# Patient Record
Sex: Male | Born: 1950 | Hispanic: No | Marital: Married | Smoking: Never smoker
Health system: Southern US, Community
[De-identification: ages and names within clinical notes are randomized; demographics above are authoritative.]

## PROBLEM LIST (undated history)

## (undated) DIAGNOSIS — N189 Chronic kidney disease, unspecified: Secondary | ICD-10-CM

## (undated) DIAGNOSIS — K219 Gastro-esophageal reflux disease without esophagitis: Secondary | ICD-10-CM

## (undated) DIAGNOSIS — I10 Essential (primary) hypertension: Secondary | ICD-10-CM

## (undated) DIAGNOSIS — R519 Headache, unspecified: Secondary | ICD-10-CM

## (undated) DIAGNOSIS — R51 Headache: Secondary | ICD-10-CM

## (undated) DIAGNOSIS — E785 Hyperlipidemia, unspecified: Secondary | ICD-10-CM

## (undated) DIAGNOSIS — C801 Malignant (primary) neoplasm, unspecified: Secondary | ICD-10-CM

## (undated) DIAGNOSIS — J302 Other seasonal allergic rhinitis: Secondary | ICD-10-CM

## (undated) HISTORY — DX: Other seasonal allergic rhinitis: J30.2

## (undated) HISTORY — DX: Gastro-esophageal reflux disease without esophagitis: K21.9

---
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
## 1971-03-21 HISTORY — PX: LYMPH NODE BIOPSY: SHX201

## 2003-10-15 ENCOUNTER — Encounter (INDEPENDENT_AMBULATORY_CARE_PROVIDER_SITE_OTHER): Payer: Self-pay | Admitting: *Deleted

## 2003-10-15 ENCOUNTER — Ambulatory Visit (HOSPITAL_COMMUNITY): Admission: RE | Admit: 2003-10-15 | Discharge: 2003-10-15 | Payer: Self-pay | Admitting: Gastroenterology

## 2004-03-24 ENCOUNTER — Encounter: Admission: RE | Admit: 2004-03-24 | Discharge: 2004-03-24 | Payer: Self-pay | Admitting: Internal Medicine

## 2004-06-26 ENCOUNTER — Emergency Department (HOSPITAL_COMMUNITY): Admission: EM | Admit: 2004-06-26 | Discharge: 2004-06-26 | Payer: Self-pay | Admitting: Family Medicine

## 2006-12-17 ENCOUNTER — Encounter: Admission: RE | Admit: 2006-12-17 | Discharge: 2006-12-17 | Payer: Self-pay | Admitting: Internal Medicine

## 2010-04-11 ENCOUNTER — Encounter: Payer: Self-pay | Admitting: Internal Medicine

## 2010-08-05 NOTE — Op Note (Signed)
NAME:  Frederick Weaver, Frederick Weaver                  ACCOUNT NO.:  [id number]   MEDICAL RECORD NO.:  [id number]                   PATIENT TYPE:   LOCATION:                                       FACILITY:   PHYSICIAN:  Danise Edge, M.D.                DATE OF BIRTH:  Oct 08, 1950   DATE OF PROCEDURE:  DATE OF DISCHARGE:                                 OPERATIVE REPORT   PROCEDURE:  Colonoscopy and polypectomy.   INDICATIONS FOR PROCEDURE:  Frederick Weaver is a 60 year old male born  November 13, 1950.  The patient is scheduled to undergo colonoscopy and  polypectomy to prevent colon cancer.   ENDOSCOPIST:  Danise Edge, M.D.   PREMEDICATION:  Fentanyl 50 mcg, Versed 10 mg.   DESCRIPTION OF PROCEDURE:  After obtaining informed consent, Frederick Weaver  was  placed in the left lateral decubitus position. I administered intravenous  fentanyl and intravenous Versed to achieve conscious sedation for the  procedure. The patient's blood pressure, oxygen saturation and cardiac  rhythm were monitored throughout the procedure and documented in the medical  record.   Anal inspection and digital rectal exam were normal. The prostate was non-  nodular. The Olympus adjustable pediatric colonoscope was introduced into  the rectum and advanced to the cecum. Colonic preparation for the exam today  was excellent.   RECTUM:  Normal.   SIGMOID COLON AND DESCENDING COLON:  Left colonic diverticulosis.   SPLENIC FLEXURE:  Normal.   TRANSVERSE COLON:  Normal.   HEPATIC FLEXURE:  Normal.   ASCENDING COLON:  Normal.   CECUM AND ILEOCECAL VALVE:  From the proximal cecum, a 2 mm sessile polyp  was removed with the electrocautery snare.   ASSESSMENT:  1. From the proximal cecum, a small polyp was removed with the     electrocautery snare.  2. Left colonic diverticulosis.   RECOMMENDATIONS:  Repeat colonoscopy in five years.                                               Danise Edge, M.D.    MJ/MEDQ  D:  10/15/2003  T:  10/15/2003  Job:  [id number]   cc:   Candyce Churn, M.D.  [address] [place]  [place] [address]  Fax: [phone]

## 2014-04-20 ENCOUNTER — Ambulatory Visit
Admission: RE | Admit: 2014-04-20 | Discharge: 2014-04-20 | Disposition: A | Discharge status: Home / Self Care | Payer: Medicare HMO | Source: Ambulatory Visit | Attending: Internal Medicine | Admitting: Internal Medicine

## 2014-04-20 ENCOUNTER — Other Ambulatory Visit: Payer: Self-pay | Admitting: Internal Medicine

## 2014-04-20 DIAGNOSIS — R05 Cough: Secondary | ICD-10-CM

## 2014-04-20 DIAGNOSIS — R059 Cough, unspecified: Secondary | ICD-10-CM

## 2014-07-16 ENCOUNTER — Other Ambulatory Visit: Payer: Self-pay | Admitting: Gastroenterology

## 2014-11-04 ENCOUNTER — Encounter (HOSPITAL_COMMUNITY): Payer: Self-pay | Admitting: *Deleted

## 2014-11-09 ENCOUNTER — Ambulatory Visit (HOSPITAL_COMMUNITY): Payer: Managed Care, Other (non HMO) | Admitting: Anesthesiology

## 2014-11-09 ENCOUNTER — Encounter (HOSPITAL_COMMUNITY)
Admission: RE | Disposition: A | Discharge status: Home / Self Care | Payer: Self-pay | Source: Ambulatory Visit | Attending: Gastroenterology

## 2014-11-09 ENCOUNTER — Ambulatory Visit (HOSPITAL_COMMUNITY)
Admission: RE | Admit: 2014-11-09 | Discharge: 2014-11-09 | Disposition: A | Discharge status: Home / Self Care | Payer: Managed Care, Other (non HMO) | Source: Ambulatory Visit | Attending: Gastroenterology | Admitting: Gastroenterology

## 2014-11-09 ENCOUNTER — Encounter (HOSPITAL_COMMUNITY): Payer: Self-pay

## 2014-11-09 DIAGNOSIS — Z1211 Encounter for screening for malignant neoplasm of colon: Secondary | ICD-10-CM | POA: Diagnosis not present

## 2014-11-09 DIAGNOSIS — Z79899 Other long term (current) drug therapy: Secondary | ICD-10-CM | POA: Insufficient documentation

## 2014-11-09 DIAGNOSIS — I10 Essential (primary) hypertension: Secondary | ICD-10-CM | POA: Insufficient documentation

## 2014-11-09 DIAGNOSIS — K5730 Diverticulosis of large intestine without perforation or abscess without bleeding: Secondary | ICD-10-CM | POA: Insufficient documentation

## 2014-11-09 DIAGNOSIS — N400 Enlarged prostate without lower urinary tract symptoms: Secondary | ICD-10-CM | POA: Diagnosis not present

## 2014-11-09 DIAGNOSIS — E780 Pure hypercholesterolemia: Secondary | ICD-10-CM | POA: Diagnosis not present

## 2014-11-09 DIAGNOSIS — Z86010 Personal history of colonic polyps: Secondary | ICD-10-CM | POA: Insufficient documentation

## 2014-11-09 HISTORY — DX: Chronic kidney disease, unspecified: N18.9

## 2014-11-09 HISTORY — DX: Malignant (primary) neoplasm, unspecified: C80.1

## 2014-11-09 HISTORY — PX: COLONOSCOPY WITH PROPOFOL: SHX5780

## 2014-11-09 HISTORY — DX: Headache, unspecified: R51.9

## 2014-11-09 HISTORY — DX: Hyperlipidemia, unspecified: E78.5

## 2014-11-09 HISTORY — DX: Headache: R51

## 2014-11-09 HISTORY — DX: Essential (primary) hypertension: I10

## 2014-11-09 SURGERY — COLONOSCOPY WITH PROPOFOL
Anesthesia: Monitor Anesthesia Care

## 2014-11-09 MED ORDER — PROPOFOL 10 MG/ML IV BOLUS
INTRAVENOUS | Status: AC
Start: 2014-11-09 — End: 2014-11-09
  Filled 2014-11-09: qty 20

## 2014-11-09 MED ORDER — PROPOFOL 10 MG/ML IV BOLUS
INTRAVENOUS | Status: DC | PRN
  Administered 2014-11-09 (×3): 20 mg via INTRAVENOUS
  Administered 2014-11-09: 30 mg via INTRAVENOUS
  Administered 2014-11-09: 10 mg via INTRAVENOUS
  Administered 2014-11-09 (×3): 20 mg via INTRAVENOUS
  Administered 2014-11-09: 30 mg via INTRAVENOUS
  Administered 2014-11-09 (×2): 10 mg via INTRAVENOUS

## 2014-11-09 MED ORDER — SODIUM CHLORIDE 0.9 % IV SOLN: INTRAVENOUS | Status: DC

## 2014-11-09 MED ORDER — LACTATED RINGERS IV SOLN
INTRAVENOUS | Status: DC
  Administered 2014-11-09: 1000 mL via INTRAVENOUS

## 2014-11-09 SURGICAL SUPPLY — 21 items

## 2014-11-09 NOTE — H&P (Signed)
  Procedure: Surveillance colonoscopy. 10/22/2008 normal surveillance colonoscopy performed. In 2005, colonoscopy performed with removal of two small adenomatous colon polyps  History: The patient is a 64 year old male born 1950/07/12. He is scheduled to undergo a surveillance colonoscopy today.  Past medical history: Hypertension. Hypercholesterolemia. Benign prostatic hypertrophy. Rosacea. Bilateral inguinal hernia surgery.  Exam: The patient is alert and lying comfortably on the endoscopy stretcher. Abdomen is soft and nontender to palpation. Lungs are clear to auscultation. Cardiac exam reveals a regular rhythm.  Plan: Proceed with surveillance colonoscopy

## 2014-11-09 NOTE — Discharge Instructions (Signed)

## 2014-11-09 NOTE — Transfer of Care (Signed)
Immediate Anesthesia Transfer of Care Note  Patient: Frederick Weaver  Procedure(s) Performed: Procedure(s): COLONOSCOPY WITH PROPOFOL (N/A)  Patient Location: PACU  Anesthesia Type:MAC  Level of Consciousness: sedated  Airway & Oxygen Therapy: Patient Spontanous Breathing and Patient connected to nasal cannula oxygen  Post-op Assessment: Report given to RN and Post -op Vital signs reviewed and stable  Post vital signs: Reviewed and stable  Last Vitals:  Filed Vitals:   11/09/14 0853  BP: 150/82  Pulse: 77  Temp: 36.4 C  Resp: 12    Complications: No apparent anesthesia complications

## 2014-11-09 NOTE — Op Note (Signed)
Procedure: Surveillance colonoscopy. 10/22/2008 normal surveillance colonoscopy performed. In 2005, colonoscopy performed with removal of a 2 mm adenomatous cecal polyp  Endoscopist: Earle Gell  Premedication: Propofol administered by anesthesia  Procedure: The patient was placed in the left lateral decubitus position. Anal inspection and digital rectal exam were normal. The Pentax pediatric colonoscope was introduced into the rectum and advanced to the cecum. A normal-appearing appendiceal orifice and ileocecal valve were identified. Colonic preparation for the exam today was good. Withdrawal time was 7 minutes  Rectum. Normal. Retroflexed view of the distal rectum was normal  Sigmoid colon and descending colon. Left colonic diverticulosis  Splenic flexure. Normal  Transverse colon. Normal  Hepatic flexure. Normal  Ascending colon. Normal  Cecum and ileocecal valve. Normal  Assessment: Normal surveillance colonoscopy  Recommendation: Schedule repeat surveillance colonoscopy in 5-10 years

## 2014-11-09 NOTE — Anesthesia Preprocedure Evaluation (Signed)
Anesthesia Evaluation  Patient identified by MRN, date of birth, ID band Patient awake    Reviewed: Allergy & Precautions, NPO status , Patient's Chart, lab work & pertinent test results  Airway Mallampati: II  TM Distance: >3 FB Neck ROM: Full    Dental no notable dental hx.    Pulmonary neg pulmonary ROS,  breath sounds clear to auscultation  Pulmonary exam normal       Cardiovascular hypertension, Pt. on medications Normal cardiovascular examRhythm:Regular Rate:Normal     Neuro/Psych negative neurological ROS  negative psych ROS   GI/Hepatic negative GI ROS, Neg liver ROS,   Endo/Other  negative endocrine ROS  Renal/GU negative Renal ROS  negative genitourinary   Musculoskeletal negative musculoskeletal ROS (+)   Abdominal   Peds negative pediatric ROS (+)  Hematology negative hematology ROS (+)   Anesthesia Other Findings   Reproductive/Obstetrics negative OB ROS                             Anesthesia Physical Anesthesia Plan  ASA: II  Anesthesia Plan: MAC   Post-op Pain Management:    Induction:   Airway Management Planned: Simple Face Mask  Additional Equipment:   Intra-op Plan:   Post-operative Plan:   Informed Consent: I have reviewed the patients History and Physical, chart, labs and discussed the procedure including the risks, benefits and alternatives for the proposed anesthesia with the patient or authorized representative who has indicated his/her understanding and acceptance.   Dental advisory given  Plan Discussed with: CRNA  Anesthesia Plan Comments:         Anesthesia Quick Evaluation

## 2014-11-09 NOTE — Anesthesia Postprocedure Evaluation (Signed)
  Anesthesia Post-op Note  Patient: Frederick Weaver Mech  Procedure(s) Performed: Procedure(s) (LRB): COLONOSCOPY WITH PROPOFOL (N/A)  Patient Location: PACU  Anesthesia Type: MAC  Level of Consciousness: awake and alert   Airway and Oxygen Therapy: Patient Spontanous Breathing  Post-op Pain: mild  Post-op Assessment: Post-op Vital signs reviewed, Patient's Cardiovascular Status Stable, Respiratory Function Stable, Patent Airway and No signs of Nausea or vomiting  Last Vitals:  Filed Vitals:   11/09/14 1030  BP: 118/79  Pulse: 83  Temp:   Resp: 18    Post-op Vital Signs: stable   Complications: No apparent anesthesia complications

## 2014-11-10 ENCOUNTER — Encounter (HOSPITAL_COMMUNITY): Payer: Self-pay | Admitting: Gastroenterology

## 2016-09-20 ENCOUNTER — Encounter (HOSPITAL_COMMUNITY): Payer: Self-pay | Admitting: Emergency Medicine

## 2016-09-20 ENCOUNTER — Ambulatory Visit (HOSPITAL_COMMUNITY)
Admission: EM | Admit: 2016-09-20 | Discharge: 2016-09-20 | Disposition: A | Discharge status: Home / Self Care | Payer: BLUE CROSS/BLUE SHIELD | Attending: Family Medicine | Admitting: Family Medicine

## 2016-09-20 DIAGNOSIS — H1033 Unspecified acute conjunctivitis, bilateral: Secondary | ICD-10-CM

## 2016-09-20 MED ORDER — TOBRAMYCIN 0.3 % OP SOLN: 1.0000 [drp] | Freq: Four times a day (QID) | OPHTHALMIC | 0 refills | Status: DC

## 2016-09-20 NOTE — ED Triage Notes (Signed)
The patient presented to the [facility] with a complaint of draining and red eyes x 2 days. The patient also complained of a possible insect bite to the right side.

## 2016-09-21 NOTE — ED Provider Notes (Signed)
  [place]   [id number] 09/20/16 Arrival Time: [place]:  Today you were diagnosed with the following: 1. Acute conjunctivitis of both eyes, unspecified acute conjunctivitis type     Meds ordered this encounter  Medications  . tobramycin (TOBREX) 0.3 % ophthalmic solution    Sig: Place 1 drop into both eyes every 6 (six) hours.    Dispense:  5 mL    Refill:  0   Observe symptoms. Ensure good handwashing. Reviewed expectations re: course of current medical issues. Questions answered. Outlined signs and symptoms indicating need for more acute intervention. Patient verbalized understanding. After Visit Summary given.   SUBJECTIVE:  Frederick Weaver is a 66 y.o. male who presents with complaint of bilateral pinkeye for 2-3 days. Irritated feeling. Watery discharge now thicker today. "Matted this morning". No eye pain or visual changes. Does not wear contacts. No OTC treatment. No light sensitivity.  ROS: As per HPI.   OBJECTIVE:  Vitals:   09/20/16 2018  BP: (!) 165/90  Pulse: 83  Resp: 18  Temp: 98 F (36.7 C)  TempSrc: Oral  SpO2: 95%     General appearance: alert; no distress Head: normocephalic; atraumatic Eyes: bilateral scleral injection; watery discharge; EOMI Skin: warm and dry; no rashes or lesions Neurologic: Alert and oriented X 3; normal symmetric reflexes; normal gait Psychological:  Alert and cooperative. Normal mood and affect   Allergies  Allergen Reactions  . Codeine Nausea Only  . Demerol [Meperidine] Nausea Only  . Ampicillin Rash  . Doxycycline Rash  . Penicillins Rash    PMHx, SurgHx, SocialHx, Medications, and Allergies were reviewed in the Visit Navigator and updated as appropriate.       Vanessa Kick, MD 09/21/16 [phone]

## 2017-02-01 DIAGNOSIS — E291 Testicular hypofunction: Secondary | ICD-10-CM | POA: Diagnosis not present

## 2017-03-08 DIAGNOSIS — E291 Testicular hypofunction: Secondary | ICD-10-CM | POA: Diagnosis not present

## 2017-03-27 DIAGNOSIS — L905 Scar conditions and fibrosis of skin: Secondary | ICD-10-CM | POA: Diagnosis not present

## 2017-03-27 DIAGNOSIS — L820 Inflamed seborrheic keratosis: Secondary | ICD-10-CM | POA: Diagnosis not present

## 2017-05-01 DIAGNOSIS — M25569 Pain in unspecified knee: Secondary | ICD-10-CM | POA: Diagnosis not present

## 2017-05-01 DIAGNOSIS — E669 Obesity, unspecified: Secondary | ICD-10-CM | POA: Diagnosis not present

## 2017-05-01 DIAGNOSIS — E7800 Pure hypercholesterolemia, unspecified: Secondary | ICD-10-CM | POA: Diagnosis not present

## 2017-05-01 DIAGNOSIS — E291 Testicular hypofunction: Secondary | ICD-10-CM | POA: Diagnosis not present

## 2017-05-01 DIAGNOSIS — Z0000 Encounter for general adult medical examination without abnormal findings: Secondary | ICD-10-CM | POA: Diagnosis not present

## 2017-05-01 DIAGNOSIS — E559 Vitamin D deficiency, unspecified: Secondary | ICD-10-CM | POA: Diagnosis not present

## 2017-05-01 DIAGNOSIS — I10 Essential (primary) hypertension: Secondary | ICD-10-CM | POA: Diagnosis not present

## 2017-05-01 DIAGNOSIS — L719 Rosacea, unspecified: Secondary | ICD-10-CM | POA: Diagnosis not present

## 2017-05-01 DIAGNOSIS — Z23 Encounter for immunization: Secondary | ICD-10-CM | POA: Diagnosis not present

## 2017-05-01 DIAGNOSIS — N529 Male erectile dysfunction, unspecified: Secondary | ICD-10-CM | POA: Diagnosis not present

## 2017-06-15 DIAGNOSIS — E291 Testicular hypofunction: Secondary | ICD-10-CM | POA: Diagnosis not present

## 2017-07-24 DIAGNOSIS — L820 Inflamed seborrheic keratosis: Secondary | ICD-10-CM | POA: Diagnosis not present

## 2017-07-24 DIAGNOSIS — L308 Other specified dermatitis: Secondary | ICD-10-CM | POA: Diagnosis not present

## 2017-07-24 DIAGNOSIS — B078 Other viral warts: Secondary | ICD-10-CM | POA: Diagnosis not present

## 2017-07-25 DIAGNOSIS — R21 Rash and other nonspecific skin eruption: Secondary | ICD-10-CM | POA: Diagnosis not present

## 2017-07-25 DIAGNOSIS — E291 Testicular hypofunction: Secondary | ICD-10-CM | POA: Diagnosis not present

## 2017-08-24 DIAGNOSIS — L718 Other rosacea: Secondary | ICD-10-CM | POA: Diagnosis not present

## 2017-08-24 DIAGNOSIS — B078 Other viral warts: Secondary | ICD-10-CM | POA: Diagnosis not present

## 2017-09-07 DIAGNOSIS — E291 Testicular hypofunction: Secondary | ICD-10-CM | POA: Diagnosis not present

## 2017-09-17 DIAGNOSIS — R69 Illness, unspecified: Secondary | ICD-10-CM | POA: Diagnosis not present

## 2017-10-26 DIAGNOSIS — E291 Testicular hypofunction: Secondary | ICD-10-CM | POA: Diagnosis not present

## 2017-11-02 DIAGNOSIS — L719 Rosacea, unspecified: Secondary | ICD-10-CM | POA: Diagnosis not present

## 2017-11-02 DIAGNOSIS — Z809 Family history of malignant neoplasm, unspecified: Secondary | ICD-10-CM | POA: Diagnosis not present

## 2017-11-02 DIAGNOSIS — I10 Essential (primary) hypertension: Secondary | ICD-10-CM | POA: Diagnosis not present

## 2017-11-02 DIAGNOSIS — Z803 Family history of malignant neoplasm of breast: Secondary | ICD-10-CM | POA: Diagnosis not present

## 2017-11-02 DIAGNOSIS — Z8249 Family history of ischemic heart disease and other diseases of the circulatory system: Secondary | ICD-10-CM | POA: Diagnosis not present

## 2017-11-02 DIAGNOSIS — L309 Dermatitis, unspecified: Secondary | ICD-10-CM | POA: Diagnosis not present

## 2017-11-02 DIAGNOSIS — Z880 Allergy status to penicillin: Secondary | ICD-10-CM | POA: Diagnosis not present

## 2017-11-02 DIAGNOSIS — E785 Hyperlipidemia, unspecified: Secondary | ICD-10-CM | POA: Diagnosis not present

## 2017-11-02 DIAGNOSIS — J302 Other seasonal allergic rhinitis: Secondary | ICD-10-CM | POA: Diagnosis not present

## 2017-12-03 DIAGNOSIS — M5136 Other intervertebral disc degeneration, lumbar region: Secondary | ICD-10-CM | POA: Diagnosis not present

## 2017-12-03 DIAGNOSIS — M25561 Pain in right knee: Secondary | ICD-10-CM | POA: Diagnosis not present

## 2017-12-03 DIAGNOSIS — M25562 Pain in left knee: Secondary | ICD-10-CM | POA: Diagnosis not present

## 2017-12-13 DIAGNOSIS — N529 Male erectile dysfunction, unspecified: Secondary | ICD-10-CM | POA: Diagnosis not present

## 2017-12-13 DIAGNOSIS — E291 Testicular hypofunction: Secondary | ICD-10-CM | POA: Diagnosis not present

## 2017-12-13 DIAGNOSIS — Z23 Encounter for immunization: Secondary | ICD-10-CM | POA: Diagnosis not present

## 2017-12-14 DIAGNOSIS — L820 Inflamed seborrheic keratosis: Secondary | ICD-10-CM | POA: Diagnosis not present

## 2017-12-14 DIAGNOSIS — L720 Epidermal cyst: Secondary | ICD-10-CM | POA: Diagnosis not present

## 2017-12-14 DIAGNOSIS — D485 Neoplasm of uncertain behavior of skin: Secondary | ICD-10-CM | POA: Diagnosis not present

## 2018-01-04 ENCOUNTER — Other Ambulatory Visit: Payer: Self-pay | Admitting: Nurse Practitioner

## 2018-01-04 ENCOUNTER — Ambulatory Visit
Admission: RE | Admit: 2018-01-04 | Discharge: 2018-01-04 | Disposition: A | Discharge status: Home / Self Care | Payer: Medicare HMO | Source: Ambulatory Visit | Attending: Nurse Practitioner | Admitting: Nurse Practitioner

## 2018-01-04 DIAGNOSIS — M542 Cervicalgia: Secondary | ICD-10-CM

## 2018-01-17 DIAGNOSIS — L7211 Pilar cyst: Secondary | ICD-10-CM | POA: Diagnosis not present

## 2018-02-01 DIAGNOSIS — E291 Testicular hypofunction: Secondary | ICD-10-CM | POA: Diagnosis not present

## 2018-03-18 DIAGNOSIS — E291 Testicular hypofunction: Secondary | ICD-10-CM | POA: Diagnosis not present

## 2018-04-02 DIAGNOSIS — R69 Illness, unspecified: Secondary | ICD-10-CM | POA: Diagnosis not present

## 2018-05-09 DIAGNOSIS — E291 Testicular hypofunction: Secondary | ICD-10-CM | POA: Diagnosis not present

## 2018-05-09 DIAGNOSIS — R351 Nocturia: Secondary | ICD-10-CM | POA: Diagnosis not present

## 2018-05-09 DIAGNOSIS — R350 Frequency of micturition: Secondary | ICD-10-CM | POA: Diagnosis not present

## 2018-05-09 DIAGNOSIS — G4733 Obstructive sleep apnea (adult) (pediatric): Secondary | ICD-10-CM | POA: Diagnosis not present

## 2018-05-09 DIAGNOSIS — E559 Vitamin D deficiency, unspecified: Secondary | ICD-10-CM | POA: Diagnosis not present

## 2018-05-09 DIAGNOSIS — E782 Mixed hyperlipidemia: Secondary | ICD-10-CM | POA: Diagnosis not present

## 2018-05-09 DIAGNOSIS — Z79899 Other long term (current) drug therapy: Secondary | ICD-10-CM | POA: Diagnosis not present

## 2018-05-09 DIAGNOSIS — J309 Allergic rhinitis, unspecified: Secondary | ICD-10-CM | POA: Diagnosis not present

## 2018-05-09 DIAGNOSIS — R3912 Poor urinary stream: Secondary | ICD-10-CM | POA: Diagnosis not present

## 2018-05-09 DIAGNOSIS — N529 Male erectile dysfunction, unspecified: Secondary | ICD-10-CM | POA: Diagnosis not present

## 2018-05-23 DIAGNOSIS — E291 Testicular hypofunction: Secondary | ICD-10-CM | POA: Diagnosis not present

## 2018-05-23 DIAGNOSIS — R3912 Poor urinary stream: Secondary | ICD-10-CM | POA: Diagnosis not present

## 2018-05-23 DIAGNOSIS — R350 Frequency of micturition: Secondary | ICD-10-CM | POA: Diagnosis not present

## 2018-05-23 DIAGNOSIS — J309 Allergic rhinitis, unspecified: Secondary | ICD-10-CM | POA: Diagnosis not present

## 2018-05-23 DIAGNOSIS — Z23 Encounter for immunization: Secondary | ICD-10-CM | POA: Diagnosis not present

## 2018-05-23 DIAGNOSIS — E559 Vitamin D deficiency, unspecified: Secondary | ICD-10-CM | POA: Diagnosis not present

## 2018-05-23 DIAGNOSIS — M5386 Other specified dorsopathies, lumbar region: Secondary | ICD-10-CM | POA: Diagnosis not present

## 2018-05-23 DIAGNOSIS — Z0000 Encounter for general adult medical examination without abnormal findings: Secondary | ICD-10-CM | POA: Diagnosis not present

## 2018-05-23 DIAGNOSIS — E782 Mixed hyperlipidemia: Secondary | ICD-10-CM | POA: Diagnosis not present

## 2018-05-23 DIAGNOSIS — R351 Nocturia: Secondary | ICD-10-CM | POA: Diagnosis not present

## 2018-05-24 DIAGNOSIS — H5213 Myopia, bilateral: Secondary | ICD-10-CM | POA: Diagnosis not present

## 2018-06-07 DIAGNOSIS — L820 Inflamed seborrheic keratosis: Secondary | ICD-10-CM | POA: Diagnosis not present

## 2018-06-07 DIAGNOSIS — B078 Other viral warts: Secondary | ICD-10-CM | POA: Diagnosis not present

## 2018-07-02 DIAGNOSIS — S90426A Blister (nonthermal), unspecified lesser toe(s), initial encounter: Secondary | ICD-10-CM | POA: Diagnosis not present

## 2018-07-02 DIAGNOSIS — S30861A Insect bite (nonvenomous) of abdominal wall, initial encounter: Secondary | ICD-10-CM | POA: Diagnosis not present

## 2018-07-11 DIAGNOSIS — L820 Inflamed seborrheic keratosis: Secondary | ICD-10-CM | POA: Diagnosis not present

## 2018-07-11 DIAGNOSIS — B078 Other viral warts: Secondary | ICD-10-CM | POA: Diagnosis not present

## 2018-08-02 DIAGNOSIS — R05 Cough: Secondary | ICD-10-CM | POA: Diagnosis not present

## 2018-08-02 DIAGNOSIS — T7840XA Allergy, unspecified, initial encounter: Secondary | ICD-10-CM | POA: Diagnosis not present

## 2018-08-02 DIAGNOSIS — R51 Headache: Secondary | ICD-10-CM | POA: Diagnosis not present

## 2018-09-02 DIAGNOSIS — M7581 Other shoulder lesions, right shoulder: Secondary | ICD-10-CM | POA: Diagnosis not present

## 2018-09-02 DIAGNOSIS — E291 Testicular hypofunction: Secondary | ICD-10-CM | POA: Diagnosis not present

## 2018-09-24 DIAGNOSIS — B078 Other viral warts: Secondary | ICD-10-CM | POA: Diagnosis not present

## 2018-09-24 DIAGNOSIS — L820 Inflamed seborrheic keratosis: Secondary | ICD-10-CM | POA: Diagnosis not present

## 2018-11-12 DIAGNOSIS — R69 Illness, unspecified: Secondary | ICD-10-CM | POA: Diagnosis not present

## 2018-11-18 DIAGNOSIS — M7581 Other shoulder lesions, right shoulder: Secondary | ICD-10-CM | POA: Diagnosis not present

## 2018-11-18 DIAGNOSIS — Z23 Encounter for immunization: Secondary | ICD-10-CM | POA: Diagnosis not present

## 2018-11-18 DIAGNOSIS — E291 Testicular hypofunction: Secondary | ICD-10-CM | POA: Diagnosis not present

## 2018-11-19 DIAGNOSIS — L718 Other rosacea: Secondary | ICD-10-CM | POA: Diagnosis not present

## 2018-11-19 DIAGNOSIS — L820 Inflamed seborrheic keratosis: Secondary | ICD-10-CM | POA: Diagnosis not present

## 2018-12-30 DIAGNOSIS — R42 Dizziness and giddiness: Secondary | ICD-10-CM | POA: Diagnosis not present

## 2018-12-30 DIAGNOSIS — Z20828 Contact with and (suspected) exposure to other viral communicable diseases: Secondary | ICD-10-CM | POA: Diagnosis not present

## 2018-12-30 DIAGNOSIS — G44209 Tension-type headache, unspecified, not intractable: Secondary | ICD-10-CM | POA: Diagnosis not present

## 2019-03-11 DIAGNOSIS — H9190 Unspecified hearing loss, unspecified ear: Secondary | ICD-10-CM | POA: Diagnosis not present

## 2019-03-11 DIAGNOSIS — K5900 Constipation, unspecified: Secondary | ICD-10-CM | POA: Diagnosis not present

## 2019-04-10 DIAGNOSIS — R208 Other disturbances of skin sensation: Secondary | ICD-10-CM | POA: Diagnosis not present

## 2019-04-10 DIAGNOSIS — L700 Acne vulgaris: Secondary | ICD-10-CM | POA: Diagnosis not present

## 2019-04-10 DIAGNOSIS — L820 Inflamed seborrheic keratosis: Secondary | ICD-10-CM | POA: Diagnosis not present

## 2019-04-11 DIAGNOSIS — R519 Headache, unspecified: Secondary | ICD-10-CM | POA: Diagnosis not present

## 2019-04-11 DIAGNOSIS — H9113 Presbycusis, bilateral: Secondary | ICD-10-CM | POA: Diagnosis not present

## 2019-04-11 DIAGNOSIS — H903 Sensorineural hearing loss, bilateral: Secondary | ICD-10-CM | POA: Diagnosis not present

## 2019-04-18 DIAGNOSIS — K219 Gastro-esophageal reflux disease without esophagitis: Secondary | ICD-10-CM | POA: Diagnosis present

## 2019-04-18 DIAGNOSIS — R519 Headache, unspecified: Secondary | ICD-10-CM | POA: Diagnosis not present

## 2019-04-18 DIAGNOSIS — H9113 Presbycusis, bilateral: Secondary | ICD-10-CM | POA: Diagnosis not present

## 2019-04-28 DIAGNOSIS — K219 Gastro-esophageal reflux disease without esophagitis: Secondary | ICD-10-CM | POA: Diagnosis not present

## 2019-04-28 DIAGNOSIS — R0982 Postnasal drip: Secondary | ICD-10-CM | POA: Diagnosis not present

## 2019-04-28 DIAGNOSIS — M542 Cervicalgia: Secondary | ICD-10-CM | POA: Diagnosis not present

## 2019-04-29 ENCOUNTER — Other Ambulatory Visit: Payer: Self-pay | Admitting: Internal Medicine

## 2019-04-29 ENCOUNTER — Ambulatory Visit
Admission: RE | Admit: 2019-04-29 | Discharge: 2019-04-29 | Disposition: A | Discharge status: Home / Self Care | Payer: Medicare HMO | Source: Ambulatory Visit | Attending: Internal Medicine | Admitting: Internal Medicine

## 2019-04-29 DIAGNOSIS — M542 Cervicalgia: Secondary | ICD-10-CM

## 2019-05-06 DIAGNOSIS — R208 Other disturbances of skin sensation: Secondary | ICD-10-CM | POA: Diagnosis not present

## 2019-05-06 DIAGNOSIS — L820 Inflamed seborrheic keratosis: Secondary | ICD-10-CM | POA: Diagnosis not present

## 2019-05-09 DIAGNOSIS — K219 Gastro-esophageal reflux disease without esophagitis: Secondary | ICD-10-CM | POA: Diagnosis not present

## 2019-05-09 DIAGNOSIS — M542 Cervicalgia: Secondary | ICD-10-CM | POA: Diagnosis not present

## 2019-05-09 DIAGNOSIS — R0982 Postnasal drip: Secondary | ICD-10-CM | POA: Diagnosis not present

## 2019-05-09 DIAGNOSIS — I10 Essential (primary) hypertension: Secondary | ICD-10-CM | POA: Diagnosis not present

## 2019-05-26 DIAGNOSIS — R69 Illness, unspecified: Secondary | ICD-10-CM | POA: Diagnosis not present

## 2019-06-12 DIAGNOSIS — R0982 Postnasal drip: Secondary | ICD-10-CM | POA: Diagnosis not present

## 2019-06-12 DIAGNOSIS — Z0001 Encounter for general adult medical examination with abnormal findings: Secondary | ICD-10-CM | POA: Diagnosis not present

## 2019-06-12 DIAGNOSIS — Z79899 Other long term (current) drug therapy: Secondary | ICD-10-CM | POA: Diagnosis not present

## 2019-06-12 DIAGNOSIS — E559 Vitamin D deficiency, unspecified: Secondary | ICD-10-CM | POA: Diagnosis not present

## 2019-06-12 DIAGNOSIS — K219 Gastro-esophageal reflux disease without esophagitis: Secondary | ICD-10-CM | POA: Diagnosis not present

## 2019-06-12 DIAGNOSIS — R05 Cough: Secondary | ICD-10-CM | POA: Diagnosis not present

## 2019-06-12 DIAGNOSIS — E7800 Pure hypercholesterolemia, unspecified: Secondary | ICD-10-CM | POA: Diagnosis not present

## 2019-06-12 DIAGNOSIS — I10 Essential (primary) hypertension: Secondary | ICD-10-CM | POA: Diagnosis not present

## 2019-06-12 DIAGNOSIS — K5900 Constipation, unspecified: Secondary | ICD-10-CM | POA: Diagnosis not present

## 2019-06-12 DIAGNOSIS — R69 Illness, unspecified: Secondary | ICD-10-CM | POA: Diagnosis not present

## 2019-07-07 DIAGNOSIS — L905 Scar conditions and fibrosis of skin: Secondary | ICD-10-CM | POA: Diagnosis not present

## 2019-07-07 DIAGNOSIS — L738 Other specified follicular disorders: Secondary | ICD-10-CM | POA: Diagnosis not present

## 2019-07-07 DIAGNOSIS — L820 Inflamed seborrheic keratosis: Secondary | ICD-10-CM | POA: Diagnosis not present

## 2019-07-07 DIAGNOSIS — I788 Other diseases of capillaries: Secondary | ICD-10-CM | POA: Diagnosis not present

## 2019-11-26 DIAGNOSIS — N401 Enlarged prostate with lower urinary tract symptoms: Secondary | ICD-10-CM | POA: Diagnosis not present

## 2019-11-26 DIAGNOSIS — R351 Nocturia: Secondary | ICD-10-CM | POA: Diagnosis not present

## 2019-12-09 DIAGNOSIS — R69 Illness, unspecified: Secondary | ICD-10-CM | POA: Diagnosis not present

## 2019-12-29 DIAGNOSIS — L820 Inflamed seborrheic keratosis: Secondary | ICD-10-CM | POA: Diagnosis not present

## 2020-01-12 DIAGNOSIS — R351 Nocturia: Secondary | ICD-10-CM | POA: Diagnosis not present

## 2020-01-12 DIAGNOSIS — N401 Enlarged prostate with lower urinary tract symptoms: Secondary | ICD-10-CM | POA: Diagnosis not present

## 2020-01-19 DIAGNOSIS — L989 Disorder of the skin and subcutaneous tissue, unspecified: Secondary | ICD-10-CM | POA: Diagnosis not present

## 2020-01-19 DIAGNOSIS — D485 Neoplasm of uncertain behavior of skin: Secondary | ICD-10-CM | POA: Diagnosis not present

## 2020-01-21 DIAGNOSIS — Z0189 Encounter for other specified special examinations: Secondary | ICD-10-CM | POA: Diagnosis not present

## 2020-02-25 DIAGNOSIS — L989 Disorder of the skin and subcutaneous tissue, unspecified: Secondary | ICD-10-CM | POA: Diagnosis not present

## 2020-02-25 DIAGNOSIS — D034 Melanoma in situ of scalp and neck: Secondary | ICD-10-CM | POA: Diagnosis not present

## 2020-02-25 DIAGNOSIS — L905 Scar conditions and fibrosis of skin: Secondary | ICD-10-CM | POA: Diagnosis not present

## 2020-02-27 DIAGNOSIS — D034 Melanoma in situ of scalp and neck: Secondary | ICD-10-CM | POA: Diagnosis not present

## 2020-04-02 DIAGNOSIS — U071 COVID-19: Secondary | ICD-10-CM | POA: Diagnosis not present

## 2020-04-06 DIAGNOSIS — Z0289 Encounter for other administrative examinations: Secondary | ICD-10-CM | POA: Diagnosis not present

## 2020-04-23 DIAGNOSIS — R058 Other specified cough: Secondary | ICD-10-CM | POA: Diagnosis not present

## 2020-05-07 DIAGNOSIS — L700 Acne vulgaris: Secondary | ICD-10-CM | POA: Diagnosis not present

## 2020-05-07 DIAGNOSIS — D1801 Hemangioma of skin and subcutaneous tissue: Secondary | ICD-10-CM | POA: Diagnosis not present

## 2020-05-07 DIAGNOSIS — D225 Melanocytic nevi of trunk: Secondary | ICD-10-CM | POA: Diagnosis not present

## 2020-05-07 DIAGNOSIS — L821 Other seborrheic keratosis: Secondary | ICD-10-CM | POA: Diagnosis not present

## 2020-05-07 DIAGNOSIS — L905 Scar conditions and fibrosis of skin: Secondary | ICD-10-CM | POA: Diagnosis not present

## 2020-05-07 DIAGNOSIS — L820 Inflamed seborrheic keratosis: Secondary | ICD-10-CM | POA: Diagnosis not present

## 2020-05-07 DIAGNOSIS — Z85820 Personal history of malignant melanoma of skin: Secondary | ICD-10-CM | POA: Diagnosis not present

## 2020-06-15 DIAGNOSIS — Z125 Encounter for screening for malignant neoplasm of prostate: Secondary | ICD-10-CM | POA: Diagnosis not present

## 2020-06-15 DIAGNOSIS — E559 Vitamin D deficiency, unspecified: Secondary | ICD-10-CM | POA: Diagnosis not present

## 2020-06-15 DIAGNOSIS — Z79899 Other long term (current) drug therapy: Secondary | ICD-10-CM | POA: Diagnosis not present

## 2020-06-15 DIAGNOSIS — E782 Mixed hyperlipidemia: Secondary | ICD-10-CM | POA: Diagnosis not present

## 2020-06-15 DIAGNOSIS — Z0000 Encounter for general adult medical examination without abnormal findings: Secondary | ICD-10-CM | POA: Diagnosis not present

## 2020-06-15 DIAGNOSIS — G4733 Obstructive sleep apnea (adult) (pediatric): Secondary | ICD-10-CM | POA: Diagnosis not present

## 2020-06-15 DIAGNOSIS — R7309 Other abnormal glucose: Secondary | ICD-10-CM | POA: Diagnosis not present

## 2020-06-15 DIAGNOSIS — R351 Nocturia: Secondary | ICD-10-CM | POA: Diagnosis not present

## 2020-06-15 DIAGNOSIS — H9190 Unspecified hearing loss, unspecified ear: Secondary | ICD-10-CM | POA: Diagnosis not present

## 2020-06-15 DIAGNOSIS — I10 Essential (primary) hypertension: Secondary | ICD-10-CM | POA: Diagnosis not present

## 2020-07-05 DIAGNOSIS — L570 Actinic keratosis: Secondary | ICD-10-CM | POA: Diagnosis not present

## 2020-07-05 DIAGNOSIS — I10 Essential (primary) hypertension: Secondary | ICD-10-CM | POA: Diagnosis not present

## 2020-07-05 DIAGNOSIS — Z1211 Encounter for screening for malignant neoplasm of colon: Secondary | ICD-10-CM | POA: Diagnosis not present

## 2020-07-05 DIAGNOSIS — Z23 Encounter for immunization: Secondary | ICD-10-CM | POA: Diagnosis not present

## 2020-07-05 DIAGNOSIS — S61219A Laceration without foreign body of unspecified finger without damage to nail, initial encounter: Secondary | ICD-10-CM | POA: Diagnosis not present

## 2020-07-09 DIAGNOSIS — Z1211 Encounter for screening for malignant neoplasm of colon: Secondary | ICD-10-CM | POA: Diagnosis not present

## 2020-07-15 DIAGNOSIS — I10 Essential (primary) hypertension: Secondary | ICD-10-CM | POA: Diagnosis not present

## 2020-08-06 DIAGNOSIS — L820 Inflamed seborrheic keratosis: Secondary | ICD-10-CM | POA: Diagnosis not present

## 2020-09-10 DIAGNOSIS — H5213 Myopia, bilateral: Secondary | ICD-10-CM | POA: Diagnosis not present

## 2020-09-17 DIAGNOSIS — I10 Essential (primary) hypertension: Secondary | ICD-10-CM | POA: Diagnosis not present

## 2020-09-17 DIAGNOSIS — L570 Actinic keratosis: Secondary | ICD-10-CM | POA: Diagnosis not present

## 2020-09-17 DIAGNOSIS — R002 Palpitations: Secondary | ICD-10-CM | POA: Diagnosis not present

## 2020-10-22 DIAGNOSIS — L259 Unspecified contact dermatitis, unspecified cause: Secondary | ICD-10-CM | POA: Diagnosis not present

## 2020-10-22 DIAGNOSIS — K5900 Constipation, unspecified: Secondary | ICD-10-CM | POA: Diagnosis not present

## 2020-10-22 DIAGNOSIS — K589 Irritable bowel syndrome without diarrhea: Secondary | ICD-10-CM | POA: Diagnosis not present

## 2020-10-22 DIAGNOSIS — R140 Abdominal distension (gaseous): Secondary | ICD-10-CM | POA: Diagnosis not present

## 2020-12-10 DIAGNOSIS — Z85820 Personal history of malignant melanoma of skin: Secondary | ICD-10-CM | POA: Diagnosis not present

## 2020-12-10 DIAGNOSIS — D2361 Other benign neoplasm of skin of right upper limb, including shoulder: Secondary | ICD-10-CM | POA: Diagnosis not present

## 2020-12-10 DIAGNOSIS — C439 Malignant melanoma of skin, unspecified: Secondary | ICD-10-CM | POA: Diagnosis not present

## 2020-12-10 DIAGNOSIS — L821 Other seborrheic keratosis: Secondary | ICD-10-CM | POA: Diagnosis not present

## 2020-12-10 DIAGNOSIS — L814 Other melanin hyperpigmentation: Secondary | ICD-10-CM | POA: Diagnosis not present

## 2020-12-10 DIAGNOSIS — Z08 Encounter for follow-up examination after completed treatment for malignant neoplasm: Secondary | ICD-10-CM | POA: Diagnosis not present

## 2020-12-10 DIAGNOSIS — L570 Actinic keratosis: Secondary | ICD-10-CM | POA: Diagnosis not present

## 2020-12-10 DIAGNOSIS — L720 Epidermal cyst: Secondary | ICD-10-CM | POA: Diagnosis not present

## 2021-01-14 DIAGNOSIS — Z0100 Encounter for examination of eyes and vision without abnormal findings: Secondary | ICD-10-CM | POA: Diagnosis not present

## 2021-01-14 DIAGNOSIS — I10 Essential (primary) hypertension: Secondary | ICD-10-CM | POA: Diagnosis not present

## 2021-01-14 DIAGNOSIS — R058 Other specified cough: Secondary | ICD-10-CM | POA: Diagnosis not present

## 2021-01-15 DIAGNOSIS — Z0100 Encounter for examination of eyes and vision without abnormal findings: Secondary | ICD-10-CM | POA: Diagnosis not present

## 2021-01-25 DIAGNOSIS — D485 Neoplasm of uncertain behavior of skin: Secondary | ICD-10-CM | POA: Diagnosis not present

## 2021-01-25 DIAGNOSIS — C4442 Squamous cell carcinoma of skin of scalp and neck: Secondary | ICD-10-CM | POA: Diagnosis not present

## 2021-02-16 DIAGNOSIS — E669 Obesity, unspecified: Secondary | ICD-10-CM | POA: Diagnosis not present

## 2021-02-16 DIAGNOSIS — N400 Enlarged prostate without lower urinary tract symptoms: Secondary | ICD-10-CM | POA: Diagnosis not present

## 2021-02-16 DIAGNOSIS — R350 Frequency of micturition: Secondary | ICD-10-CM | POA: Diagnosis not present

## 2021-02-16 DIAGNOSIS — E7800 Pure hypercholesterolemia, unspecified: Secondary | ICD-10-CM | POA: Diagnosis not present

## 2021-02-16 DIAGNOSIS — N401 Enlarged prostate with lower urinary tract symptoms: Secondary | ICD-10-CM | POA: Diagnosis not present

## 2021-02-21 DIAGNOSIS — C44329 Squamous cell carcinoma of skin of other parts of face: Secondary | ICD-10-CM | POA: Diagnosis not present

## 2021-02-21 DIAGNOSIS — Z481 Encounter for planned postprocedural wound closure: Secondary | ICD-10-CM | POA: Diagnosis not present

## 2021-02-21 DIAGNOSIS — C4492 Squamous cell carcinoma of skin, unspecified: Secondary | ICD-10-CM | POA: Diagnosis not present

## 2021-03-04 DIAGNOSIS — K130 Diseases of lips: Secondary | ICD-10-CM | POA: Diagnosis not present

## 2021-03-04 DIAGNOSIS — I10 Essential (primary) hypertension: Secondary | ICD-10-CM | POA: Diagnosis not present

## 2021-03-04 DIAGNOSIS — R350 Frequency of micturition: Secondary | ICD-10-CM | POA: Diagnosis not present

## 2021-04-08 DIAGNOSIS — I10 Essential (primary) hypertension: Secondary | ICD-10-CM | POA: Diagnosis not present

## 2021-04-08 DIAGNOSIS — E7800 Pure hypercholesterolemia, unspecified: Secondary | ICD-10-CM | POA: Diagnosis not present

## 2021-04-08 DIAGNOSIS — D229 Melanocytic nevi, unspecified: Secondary | ICD-10-CM | POA: Diagnosis not present

## 2021-04-08 DIAGNOSIS — J029 Acute pharyngitis, unspecified: Secondary | ICD-10-CM | POA: Diagnosis not present

## 2021-04-14 DIAGNOSIS — L820 Inflamed seborrheic keratosis: Secondary | ICD-10-CM | POA: Diagnosis not present

## 2021-04-14 DIAGNOSIS — L821 Other seborrheic keratosis: Secondary | ICD-10-CM | POA: Diagnosis not present

## 2021-04-14 DIAGNOSIS — D485 Neoplasm of uncertain behavior of skin: Secondary | ICD-10-CM | POA: Diagnosis not present

## 2021-04-14 DIAGNOSIS — L538 Other specified erythematous conditions: Secondary | ICD-10-CM | POA: Diagnosis not present

## 2021-04-28 DIAGNOSIS — J3489 Other specified disorders of nose and nasal sinuses: Secondary | ICD-10-CM | POA: Diagnosis not present

## 2021-04-28 DIAGNOSIS — Z87898 Personal history of other specified conditions: Secondary | ICD-10-CM | POA: Diagnosis not present

## 2021-04-28 DIAGNOSIS — K1370 Unspecified lesions of oral mucosa: Secondary | ICD-10-CM | POA: Diagnosis not present

## 2021-05-12 DIAGNOSIS — E7800 Pure hypercholesterolemia, unspecified: Secondary | ICD-10-CM | POA: Diagnosis not present

## 2021-05-12 DIAGNOSIS — R42 Dizziness and giddiness: Secondary | ICD-10-CM | POA: Diagnosis not present

## 2021-06-02 DIAGNOSIS — L570 Actinic keratosis: Secondary | ICD-10-CM | POA: Diagnosis not present

## 2021-06-02 DIAGNOSIS — L821 Other seborrheic keratosis: Secondary | ICD-10-CM | POA: Diagnosis not present

## 2021-06-02 DIAGNOSIS — Z85828 Personal history of other malignant neoplasm of skin: Secondary | ICD-10-CM | POA: Diagnosis not present

## 2021-06-02 DIAGNOSIS — Z85820 Personal history of malignant melanoma of skin: Secondary | ICD-10-CM | POA: Diagnosis not present

## 2021-06-02 DIAGNOSIS — Z08 Encounter for follow-up examination after completed treatment for malignant neoplasm: Secondary | ICD-10-CM | POA: Diagnosis not present

## 2021-06-02 DIAGNOSIS — D225 Melanocytic nevi of trunk: Secondary | ICD-10-CM | POA: Diagnosis not present

## 2021-06-02 DIAGNOSIS — D485 Neoplasm of uncertain behavior of skin: Secondary | ICD-10-CM | POA: Diagnosis not present

## 2021-06-02 DIAGNOSIS — L814 Other melanin hyperpigmentation: Secondary | ICD-10-CM | POA: Diagnosis not present

## 2021-06-02 DIAGNOSIS — L820 Inflamed seborrheic keratosis: Secondary | ICD-10-CM | POA: Diagnosis not present

## 2021-06-03 DIAGNOSIS — H5213 Myopia, bilateral: Secondary | ICD-10-CM | POA: Diagnosis not present

## 2021-06-22 DIAGNOSIS — B349 Viral infection, unspecified: Secondary | ICD-10-CM | POA: Diagnosis not present

## 2021-07-08 DIAGNOSIS — J208 Acute bronchitis due to other specified organisms: Secondary | ICD-10-CM | POA: Diagnosis not present

## 2021-07-21 ENCOUNTER — Ambulatory Visit
Admission: RE | Admit: 2021-07-21 | Discharge: 2021-07-21 | Disposition: A | Discharge status: Home / Self Care | Payer: Medicare HMO | Source: Ambulatory Visit | Attending: Internal Medicine | Admitting: Internal Medicine

## 2021-07-21 ENCOUNTER — Other Ambulatory Visit: Payer: Self-pay | Admitting: Internal Medicine

## 2021-07-21 DIAGNOSIS — R053 Chronic cough: Secondary | ICD-10-CM | POA: Diagnosis not present

## 2021-07-21 DIAGNOSIS — E782 Mixed hyperlipidemia: Secondary | ICD-10-CM | POA: Diagnosis not present

## 2021-07-21 DIAGNOSIS — R7309 Other abnormal glucose: Secondary | ICD-10-CM | POA: Diagnosis not present

## 2021-07-21 DIAGNOSIS — Z79899 Other long term (current) drug therapy: Secondary | ICD-10-CM | POA: Diagnosis not present

## 2021-07-21 DIAGNOSIS — N529 Male erectile dysfunction, unspecified: Secondary | ICD-10-CM | POA: Diagnosis not present

## 2021-07-21 DIAGNOSIS — Z1211 Encounter for screening for malignant neoplasm of colon: Secondary | ICD-10-CM | POA: Diagnosis not present

## 2021-07-21 DIAGNOSIS — R351 Nocturia: Secondary | ICD-10-CM | POA: Diagnosis not present

## 2021-07-21 DIAGNOSIS — E7800 Pure hypercholesterolemia, unspecified: Secondary | ICD-10-CM | POA: Diagnosis not present

## 2021-07-21 DIAGNOSIS — I10 Essential (primary) hypertension: Secondary | ICD-10-CM | POA: Diagnosis not present

## 2021-07-21 DIAGNOSIS — Z0001 Encounter for general adult medical examination with abnormal findings: Secondary | ICD-10-CM | POA: Diagnosis not present

## 2021-07-21 DIAGNOSIS — R7303 Prediabetes: Secondary | ICD-10-CM | POA: Diagnosis not present

## 2021-07-21 DIAGNOSIS — Z125 Encounter for screening for malignant neoplasm of prostate: Secondary | ICD-10-CM | POA: Diagnosis not present

## 2021-07-21 DIAGNOSIS — E559 Vitamin D deficiency, unspecified: Secondary | ICD-10-CM | POA: Diagnosis not present

## 2021-08-19 DIAGNOSIS — I10 Essential (primary) hypertension: Secondary | ICD-10-CM | POA: Diagnosis not present

## 2021-08-19 DIAGNOSIS — R053 Chronic cough: Secondary | ICD-10-CM | POA: Diagnosis not present

## 2021-08-19 DIAGNOSIS — R7303 Prediabetes: Secondary | ICD-10-CM | POA: Diagnosis not present

## 2021-08-19 DIAGNOSIS — L820 Inflamed seborrheic keratosis: Secondary | ICD-10-CM | POA: Diagnosis not present

## 2021-11-04 DIAGNOSIS — L820 Inflamed seborrheic keratosis: Secondary | ICD-10-CM | POA: Diagnosis not present

## 2021-11-04 DIAGNOSIS — I10 Essential (primary) hypertension: Secondary | ICD-10-CM | POA: Diagnosis not present

## 2021-12-06 DIAGNOSIS — I10 Essential (primary) hypertension: Secondary | ICD-10-CM | POA: Diagnosis not present

## 2021-12-06 DIAGNOSIS — L820 Inflamed seborrheic keratosis: Secondary | ICD-10-CM | POA: Diagnosis not present

## 2022-01-06 DIAGNOSIS — D125 Benign neoplasm of sigmoid colon: Secondary | ICD-10-CM | POA: Diagnosis not present

## 2022-01-06 DIAGNOSIS — D124 Benign neoplasm of descending colon: Secondary | ICD-10-CM | POA: Diagnosis not present

## 2022-01-06 DIAGNOSIS — Z86010 Personal history of colonic polyps: Secondary | ICD-10-CM | POA: Diagnosis not present

## 2022-01-06 DIAGNOSIS — Z09 Encounter for follow-up examination after completed treatment for conditions other than malignant neoplasm: Secondary | ICD-10-CM | POA: Diagnosis not present

## 2022-01-06 DIAGNOSIS — K648 Other hemorrhoids: Secondary | ICD-10-CM | POA: Diagnosis not present

## 2022-01-06 DIAGNOSIS — K5730 Diverticulosis of large intestine without perforation or abscess without bleeding: Secondary | ICD-10-CM | POA: Diagnosis not present

## 2022-01-06 DIAGNOSIS — D123 Benign neoplasm of transverse colon: Secondary | ICD-10-CM | POA: Diagnosis not present

## 2022-01-10 DIAGNOSIS — D125 Benign neoplasm of sigmoid colon: Secondary | ICD-10-CM | POA: Diagnosis not present

## 2022-02-02 DIAGNOSIS — Z03818 Encounter for observation for suspected exposure to other biological agents ruled out: Secondary | ICD-10-CM | POA: Diagnosis not present

## 2022-02-02 DIAGNOSIS — U071 COVID-19: Secondary | ICD-10-CM | POA: Diagnosis not present

## 2022-02-02 DIAGNOSIS — J069 Acute upper respiratory infection, unspecified: Secondary | ICD-10-CM | POA: Diagnosis not present

## 2022-03-10 DIAGNOSIS — R0981 Nasal congestion: Secondary | ICD-10-CM | POA: Diagnosis not present

## 2022-03-10 DIAGNOSIS — R040 Epistaxis: Secondary | ICD-10-CM | POA: Diagnosis not present

## 2022-05-03 DIAGNOSIS — Z85828 Personal history of other malignant neoplasm of skin: Secondary | ICD-10-CM | POA: Diagnosis not present

## 2022-05-03 DIAGNOSIS — Z85820 Personal history of malignant melanoma of skin: Secondary | ICD-10-CM | POA: Diagnosis not present

## 2022-05-03 DIAGNOSIS — D225 Melanocytic nevi of trunk: Secondary | ICD-10-CM | POA: Diagnosis not present

## 2022-05-03 DIAGNOSIS — L814 Other melanin hyperpigmentation: Secondary | ICD-10-CM | POA: Diagnosis not present

## 2022-05-03 DIAGNOSIS — Z08 Encounter for follow-up examination after completed treatment for malignant neoplasm: Secondary | ICD-10-CM | POA: Diagnosis not present

## 2022-05-03 DIAGNOSIS — C439 Malignant melanoma of skin, unspecified: Secondary | ICD-10-CM | POA: Diagnosis not present

## 2022-05-03 DIAGNOSIS — L820 Inflamed seborrheic keratosis: Secondary | ICD-10-CM | POA: Diagnosis not present

## 2022-05-03 DIAGNOSIS — L218 Other seborrheic dermatitis: Secondary | ICD-10-CM | POA: Diagnosis not present

## 2022-05-03 DIAGNOSIS — L821 Other seborrheic keratosis: Secondary | ICD-10-CM | POA: Diagnosis not present

## 2022-05-08 IMAGING — DX DG CHEST 2V
2 series · 2 of 2 positions shown · non-contrast
Comparison: 04/20/2014

CLINICAL DATA: Chronic cough for several days

EXAM:
CHEST - 2 VIEW

[dg chest 2 view (1 of 2)]
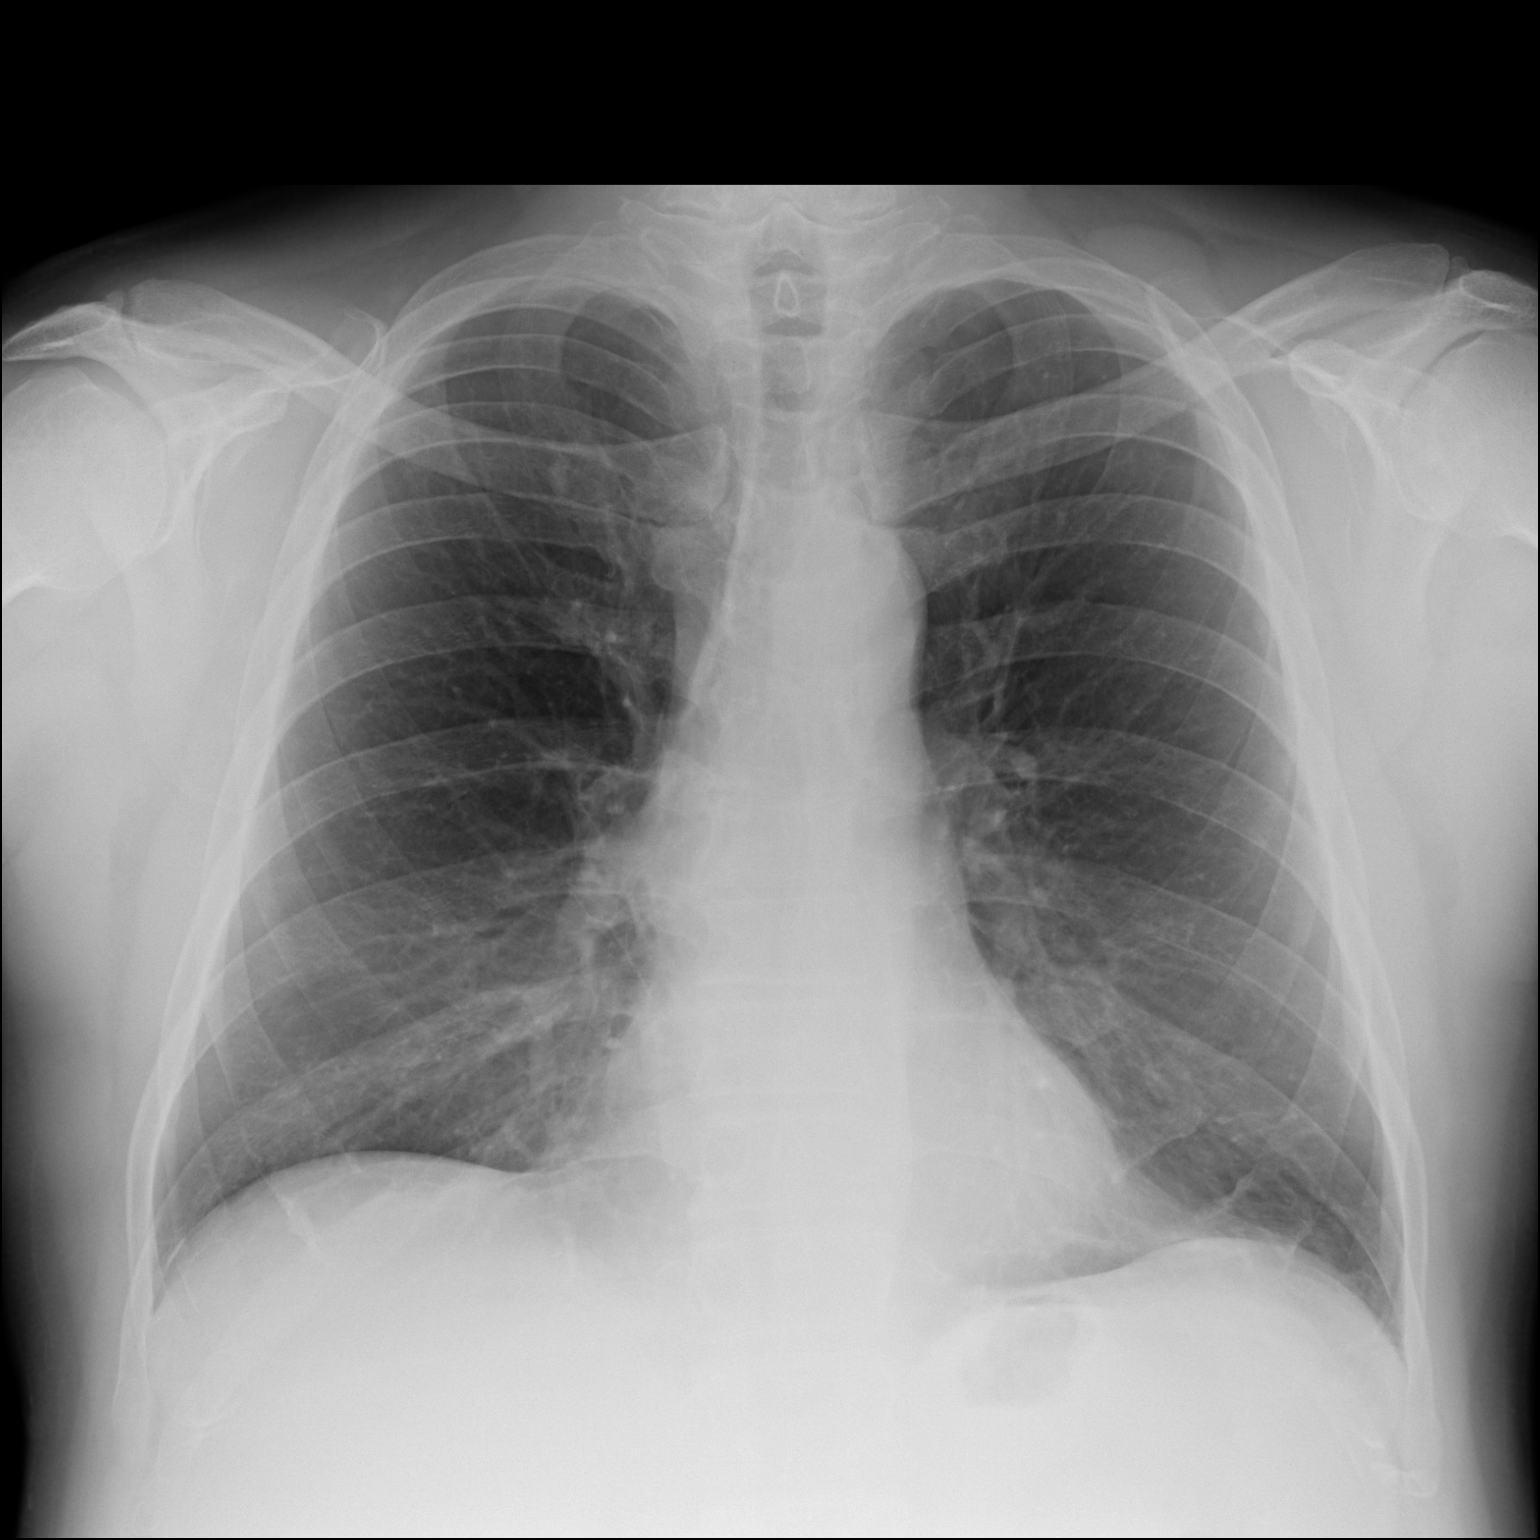

[dg chest 2 view (2 of 2)]
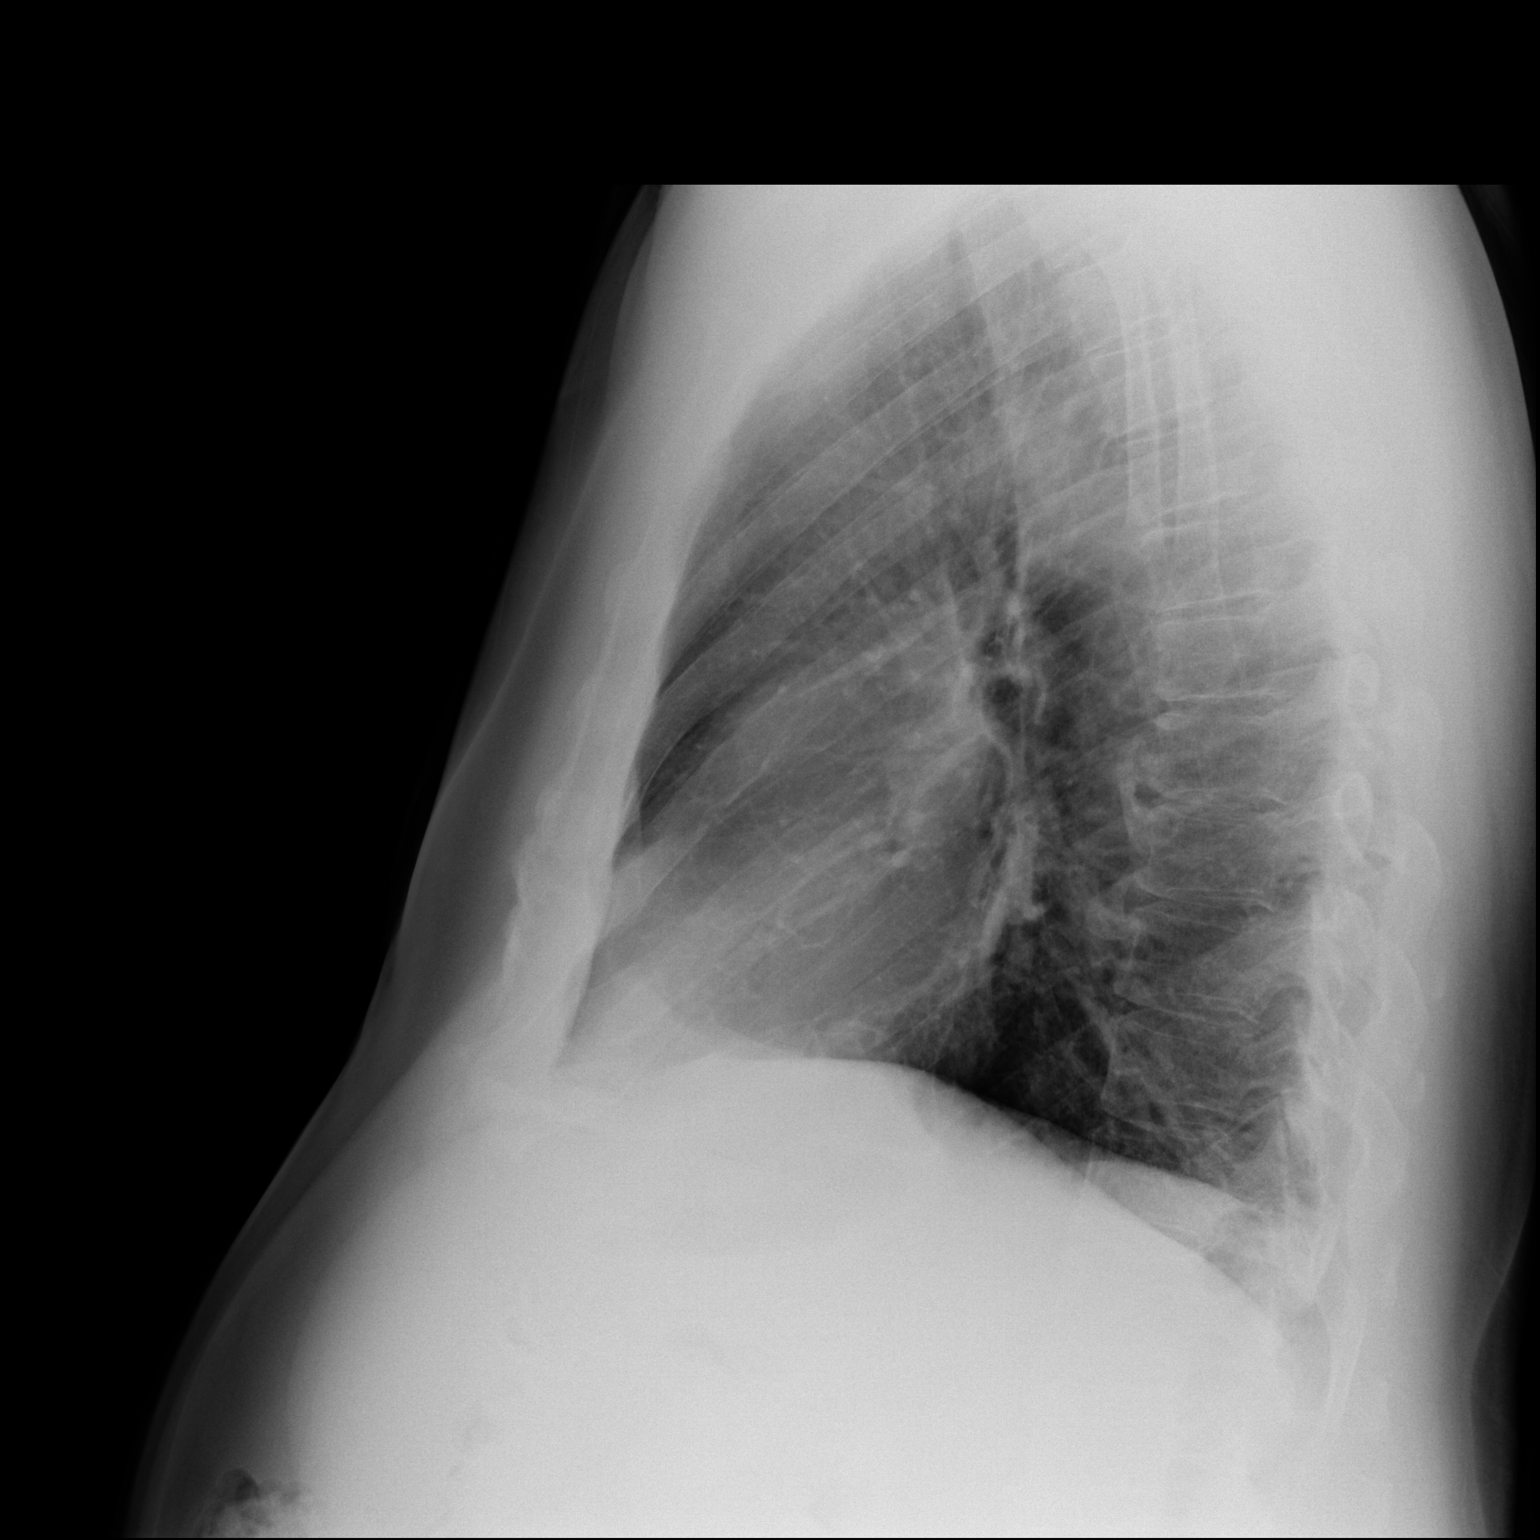

[2 of 2 positions shown; findings below may reference images not displayed]

FINDINGS: The heart size and mediastinal contours are within normal limits.
Both lungs are clear. The visualized skeletal structures are
unremarkable.
IMPRESSION: No active cardiopulmonary disease.

## 2022-09-12 DIAGNOSIS — L820 Inflamed seborrheic keratosis: Secondary | ICD-10-CM | POA: Diagnosis not present

## 2022-09-12 DIAGNOSIS — D485 Neoplasm of uncertain behavior of skin: Secondary | ICD-10-CM | POA: Diagnosis not present

## 2022-09-12 DIAGNOSIS — L298 Other pruritus: Secondary | ICD-10-CM | POA: Diagnosis not present

## 2022-09-12 DIAGNOSIS — L538 Other specified erythematous conditions: Secondary | ICD-10-CM | POA: Diagnosis not present

## 2022-09-14 DIAGNOSIS — L989 Disorder of the skin and subcutaneous tissue, unspecified: Secondary | ICD-10-CM | POA: Diagnosis not present

## 2022-09-28 DIAGNOSIS — D485 Neoplasm of uncertain behavior of skin: Secondary | ICD-10-CM | POA: Diagnosis not present

## 2022-09-28 DIAGNOSIS — L439 Lichen planus, unspecified: Secondary | ICD-10-CM | POA: Diagnosis not present

## 2022-10-02 DIAGNOSIS — D485 Neoplasm of uncertain behavior of skin: Secondary | ICD-10-CM | POA: Diagnosis not present

## 2022-10-27 DIAGNOSIS — H52223 Regular astigmatism, bilateral: Secondary | ICD-10-CM | POA: Diagnosis not present

## 2022-10-27 DIAGNOSIS — Z135 Encounter for screening for eye and ear disorders: Secondary | ICD-10-CM | POA: Diagnosis not present

## 2022-10-27 DIAGNOSIS — H2513 Age-related nuclear cataract, bilateral: Secondary | ICD-10-CM | POA: Diagnosis not present

## 2022-10-27 DIAGNOSIS — H5213 Myopia, bilateral: Secondary | ICD-10-CM | POA: Diagnosis not present

## 2022-10-27 DIAGNOSIS — H524 Presbyopia: Secondary | ICD-10-CM | POA: Diagnosis not present

## 2022-10-28 DIAGNOSIS — S80812A Abrasion, left lower leg, initial encounter: Secondary | ICD-10-CM | POA: Diagnosis not present

## 2022-10-31 DIAGNOSIS — L218 Other seborrheic dermatitis: Secondary | ICD-10-CM | POA: Diagnosis not present

## 2022-10-31 DIAGNOSIS — D225 Melanocytic nevi of trunk: Secondary | ICD-10-CM | POA: Diagnosis not present

## 2022-10-31 DIAGNOSIS — Z85820 Personal history of malignant melanoma of skin: Secondary | ICD-10-CM | POA: Diagnosis not present

## 2022-10-31 DIAGNOSIS — L237 Allergic contact dermatitis due to plants, except food: Secondary | ICD-10-CM | POA: Diagnosis not present

## 2022-10-31 DIAGNOSIS — L814 Other melanin hyperpigmentation: Secondary | ICD-10-CM | POA: Diagnosis not present

## 2022-10-31 DIAGNOSIS — L821 Other seborrheic keratosis: Secondary | ICD-10-CM | POA: Diagnosis not present

## 2022-10-31 DIAGNOSIS — Z08 Encounter for follow-up examination after completed treatment for malignant neoplasm: Secondary | ICD-10-CM | POA: Diagnosis not present

## 2022-10-31 DIAGNOSIS — L298 Other pruritus: Secondary | ICD-10-CM | POA: Diagnosis not present

## 2022-11-10 DIAGNOSIS — Z6831 Body mass index (BMI) 31.0-31.9, adult: Secondary | ICD-10-CM | POA: Diagnosis not present

## 2022-11-10 DIAGNOSIS — Z1211 Encounter for screening for malignant neoplasm of colon: Secondary | ICD-10-CM | POA: Diagnosis not present

## 2022-11-10 DIAGNOSIS — I10 Essential (primary) hypertension: Secondary | ICD-10-CM | POA: Diagnosis not present

## 2022-11-10 DIAGNOSIS — Z1331 Encounter for screening for depression: Secondary | ICD-10-CM | POA: Diagnosis not present

## 2022-11-10 DIAGNOSIS — Z0000 Encounter for general adult medical examination without abnormal findings: Secondary | ICD-10-CM | POA: Diagnosis not present

## 2022-11-10 DIAGNOSIS — E559 Vitamin D deficiency, unspecified: Secondary | ICD-10-CM | POA: Diagnosis not present

## 2022-11-10 DIAGNOSIS — R7303 Prediabetes: Secondary | ICD-10-CM | POA: Diagnosis not present

## 2022-11-10 DIAGNOSIS — H9190 Unspecified hearing loss, unspecified ear: Secondary | ICD-10-CM | POA: Diagnosis not present

## 2022-11-10 DIAGNOSIS — E782 Mixed hyperlipidemia: Secondary | ICD-10-CM | POA: Diagnosis not present

## 2022-11-10 DIAGNOSIS — Z23 Encounter for immunization: Secondary | ICD-10-CM | POA: Diagnosis not present

## 2022-11-10 DIAGNOSIS — Z79899 Other long term (current) drug therapy: Secondary | ICD-10-CM | POA: Diagnosis not present

## 2022-11-10 DIAGNOSIS — R351 Nocturia: Secondary | ICD-10-CM | POA: Diagnosis not present

## 2022-11-10 DIAGNOSIS — Z125 Encounter for screening for malignant neoplasm of prostate: Secondary | ICD-10-CM | POA: Diagnosis not present

## 2022-11-10 DIAGNOSIS — E6609 Other obesity due to excess calories: Secondary | ICD-10-CM | POA: Diagnosis not present

## 2022-11-10 DIAGNOSIS — N529 Male erectile dysfunction, unspecified: Secondary | ICD-10-CM | POA: Diagnosis not present

## 2022-11-10 DIAGNOSIS — Z9181 History of falling: Secondary | ICD-10-CM | POA: Diagnosis not present

## 2022-12-25 DIAGNOSIS — R1319 Other dysphagia: Secondary | ICD-10-CM | POA: Diagnosis not present

## 2022-12-25 DIAGNOSIS — R141 Gas pain: Secondary | ICD-10-CM | POA: Diagnosis not present

## 2023-01-15 ENCOUNTER — Encounter: Payer: Self-pay | Admitting: Internal Medicine

## 2023-01-15 ENCOUNTER — Ambulatory Visit: Payer: Medicare HMO | Admitting: Internal Medicine

## 2023-01-15 VITALS — BP 130/70 | HR 74 | Ht 67.5 in | Wt 198.0 lb

## 2023-01-15 DIAGNOSIS — R1310 Dysphagia, unspecified: Secondary | ICD-10-CM | POA: Diagnosis not present

## 2023-01-15 DIAGNOSIS — K219 Gastro-esophageal reflux disease without esophagitis: Secondary | ICD-10-CM

## 2023-01-15 NOTE — Patient Instructions (Addendum)
You have been scheduled for an endoscopy. Please follow written instructions given to you at your visit today.  If you use inhalers (even only as needed), please bring them with you on the day of your procedure.  If you take any of the following medications, they will need to be adjusted prior to your procedure:   DO NOT TAKE 7 DAYS PRIOR TO TEST- Trulicity (dulaglutide) Ozempic, Wegovy (semaglutide) Mounjaro (tirzepatide) Bydureon Bcise (exanatide extended release)  DO NOT TAKE 1 DAY PRIOR TO YOUR TEST Rybelsus (semaglutide) Adlyxin (lixisenatide) Victoza (liraglutide) Byetta (exanatide)   If your blood pressure at your visit was 140/90 or greater, please contact your primary care physician to follow up on this.  _______________________________________________________  If you are age 71 or older, your body mass index should be between 23-30. Your Body mass index is 30.55 kg/m. If this is out of the aforementioned range listed, please consider follow up with your Primary Care Provider.  If you are age 15 or younger, your body mass index should be between 19-25. Your Body mass index is 30.55 kg/m. If this is out of the aformentioned range listed, please consider follow up with your Primary Care Provider.   ________________________________________________________  The [place] [facility] providers would like to encourage you to use [facility] to communicate with providers for non-urgent requests or questions.  Due to long hold times on the telephone, sending your provider a message by [facility] may be a faster and more efficient way to get a response.  Please allow 48 business hours for a response.  Please remember that this is for non-urgent requests.  _______________________________________________________ Due to recent changes in healthcare laws, you may see the results of your imaging and laboratory studies on MyChart before your provider has had a chance to review them.  We understand that in  some cases there may be results that are confusing or concerning to you. Not all laboratory results come back in the same time frame and the provider may be waiting for multiple results in order to interpret others.  Please give [place] 48 hours in order for your provider to thoroughly review all the results before contacting the office for clarification of your results.   Thank you for entrusting me with your care and for choosing [facility], Dr. Eulah Pont

## 2023-01-15 NOTE — Progress Notes (Signed)
Chief Complaint: Dysphagia  HPI : 72 year old male with history of osteoarthritis, obesity, OSA, and GERD presents with dysphagia  Back in 08/2022, he was in [facility] and ate some BBQ. He then felt choked up due to the [facility] and had to eventually throw it up. During his dysphagia episode, he was scared that he might die. A couple of times since that episode, he has had a sensation of gas in his chest. When this gas sensation happens, he gets worried that he will get choked up again. He has been taking Mylanta, which has helped with his gas sensation. He was recently seen by his PCP for dysphagia and was started on omeprazole 40 mg every day. He used to be on Pepcid but would still have breakthrough reflux. He has been on PPI for 2 weeks, and he is not sure if it is helping. The Mylanta has helped more than the PPI in his opinion. He eats slower now than he did in the past. Has not had any overt dysphagia since 08/2022.  Denies N&V since that episode as well. Denies weight loss, ab pain, blood in the stools, diarrhea, or constipation. Denies prior EGD. Last colonoscopy was in 2016 that was normal. Father had GERD. Denies other family history of GI issues. Denies use of blood thinners. His labs with his PCP have been normal.  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   Hyperlipidemia    Hypertension    Seasonal allergies      Past Surgical History:  Procedure Laterality Date   COLONOSCOPY WITH PROPOFOL N/A 11/09/2014   Procedure: COLONOSCOPY WITH PROPOFOL;  Surgeon: Charolett Bumpers, MD;  Location: [facility];  Service: Endoscopy;  Laterality: N/A;   HERNIA REPAIR  03/20/1970   inguinal   LYMPH NODE BIOPSY  03/21/1971   benign   Family History  Problem Relation Age of Onset   Heart disease Mother     Congestive Heart Failure Mother    Lung cancer Father        smoker but had quit years before   Colon cancer Neg Hx    Esophageal cancer Neg Hx    Social History   Tobacco Use   Smoking status: Never   Smokeless tobacco: Never  Vaping Use   Vaping status: Never Used  Substance Use Topics   Alcohol use: No   Drug use: No   Current Outpatient Medications  Medication Sig Dispense Refill   aluminum-magnesium hydroxide 200-200 MG/5ML suspension Take 20 mLs by mouth every 6 (six) hours as needed for indigestion.     carvedilol (COREG) 6.25 MG tablet Take 6.25 mg by mouth 2 (two) times daily with a meal.     cholecalciferol (VITAMIN D) 1000 UNITS tablet Take 1,000 Units by mouth daily.     fexofenadine (ALLEGRA) 180 MG tablet Take 180 mg by mouth daily.     Fiber Adult Gummies 2 g CHEW Chews 2 daily     L-Lysine 500 MG TABS Take 1 tablet by mouth daily.     losartan-hydrochlorothiazide (HYZAAR) 100-12.5 MG tablet Take 1 tablet by mouth daily.     MULTIPLE VITAMIN PO Take 1 tablet by mouth daily.  omeprazole (PRILOSEC) 40 MG capsule Take 40 mg by mouth daily.     rosuvastatin (CRESTOR) 5 MG tablet Take 1 tablet by mouth daily.     triamcinolone (NASACORT ALLERGY 24HR) 55 MCG/ACT AERO nasal inhaler Place 1 spray into the nose daily.     No current facility-administered medications for this visit.   Allergies  Allergen Reactions   Codeine Nausea Only   Demerol [Meperidine] Nausea Only   Ampicillin Rash   Doxycycline Rash   Penicillins Rash   Review of Systems: All systems reviewed and negative except where noted in HPI.   Physical Exam: BP 130/70   Pulse 74   Ht 5' 7.5" (1.715 m)   Wt 198 lb (89.8 kg)   BMI 30.55 kg/m  Constitutional: Pleasant,well-developed, male in no acute distress. HEENT: Normocephalic and atraumatic. Conjunctivae are normal. No scleral icterus. Cardiovascular: Normal rate, regular rhythm.  Pulmonary/chest: Effort normal and breath sounds normal.  No wheezing, rales or rhonchi. Abdominal: Soft, nondistended, nontender. Bowel sounds active throughout. There are no masses palpable. No hepatomegaly. Extremities: No edema Neurological: Alert and oriented to person place and time. Skin: Skin is warm and dry. No rashes noted. Psychiatric: Normal mood and affect. Behavior is normal.  In 2005, colonoscopy performed with removal of a 2 mm adenomatous cecal polyp   10/22/2008 normal surveillance colonoscopy performed.   Had a normal colonoscopy in 2016  ASSESSMENT AND PLAN: Dysphagia GERD Patient presents with a severe episode of dysphagia in 08/2022, which eventually resolved with vomiting.  Since then, patient has continued to experience a gas sensation in his chest that worsens with eating certain foods such as pizza and improves with use of Mylanta therapy.  Patient's gas sensation may be a manifestation of acid reflux so I went over reflux friendly diet as well as conservative treatment options for reflux today.  Patient can continue his PPI therapy and Mylanta as needed.  Will plan for EGD for further evaluation of a source of his dysphagia. - GERD hand out - Continue PPI every day and Mylanta PRN - EGD [facility] - Next colonoscopy due in 2026 for colon cancer screening  Eulah Pont, MD  I spent 45 minutes of time, including in depth chart review, independent review of results as outlined above, communicating results with the patient directly, face-to-face time with the patient, coordinating care, ordering studies and medications as appropriate, and documentation.

## 2023-01-17 ENCOUNTER — Ambulatory Visit: Payer: Medicare HMO | Admitting: Internal Medicine

## 2023-01-17 ENCOUNTER — Encounter: Payer: Self-pay | Admitting: Internal Medicine

## 2023-01-17 VITALS — BP 133/78 | HR 64 | Temp 98.4°F | Resp 12 | Ht 67.0 in | Wt 198.0 lb

## 2023-01-17 DIAGNOSIS — I10 Essential (primary) hypertension: Secondary | ICD-10-CM | POA: Diagnosis not present

## 2023-01-17 DIAGNOSIS — K224 Dyskinesia of esophagus: Secondary | ICD-10-CM | POA: Diagnosis present

## 2023-01-17 DIAGNOSIS — R1310 Dysphagia, unspecified: Secondary | ICD-10-CM

## 2023-01-17 DIAGNOSIS — K222 Esophageal obstruction: Secondary | ICD-10-CM

## 2023-01-17 DIAGNOSIS — K317 Polyp of stomach and duodenum: Secondary | ICD-10-CM | POA: Diagnosis not present

## 2023-01-17 DIAGNOSIS — E785 Hyperlipidemia, unspecified: Secondary | ICD-10-CM | POA: Diagnosis not present

## 2023-01-17 DIAGNOSIS — K2970 Gastritis, unspecified, without bleeding: Secondary | ICD-10-CM

## 2023-01-17 DIAGNOSIS — K2270 Barrett's esophagus without dysplasia: Secondary | ICD-10-CM | POA: Diagnosis not present

## 2023-01-17 DIAGNOSIS — K319 Disease of stomach and duodenum, unspecified: Secondary | ICD-10-CM | POA: Diagnosis not present

## 2023-01-17 DIAGNOSIS — K3189 Other diseases of stomach and duodenum: Secondary | ICD-10-CM | POA: Diagnosis not present

## 2023-01-17 MED ORDER — OMEPRAZOLE 40 MG PO CPDR: 40.0000 mg | DELAYED_RELEASE_CAPSULE | Freq: Two times a day (BID) | ORAL | 1 refills | Status: DC

## 2023-01-17 MED ORDER — SODIUM CHLORIDE 0.9 % IV SOLN: 500.0000 mL | INTRAVENOUS | Status: DC

## 2023-01-17 NOTE — Progress Notes (Signed)
GASTROENTEROLOGY PROCEDURE H&P NOTE   Primary Care Physician: Emilio Aspen, MD    Reason for Procedure:   Dysphagia, GERD  Plan:    EGD  Patient is appropriate for endoscopic procedure(s) in the ambulatory ([facility]) setting.  The nature of the procedure, as well as the risks, benefits, and alternatives were carefully and thoroughly reviewed with the patient. Ample time for discussion and questions allowed. The patient understood, was satisfied, and agreed to proceed.     HPI: Frederick Weaver. is a 72 y.o. male who presents for EGD for evaluation of dysphagia and GERD .  Patient was most recently seen in the Gastroenterology Clinic on 01/15/23.  No interval change in medical history since that appointment. Please refer to that note for full details regarding GI history and clinical presentation.   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   Hyperlipidemia    Hypertension    Seasonal allergies     Past Surgical History:  Procedure Laterality Date   COLONOSCOPY WITH PROPOFOL N/A 11/09/2014   Procedure: COLONOSCOPY WITH PROPOFOL;  Surgeon: Charolett Bumpers, MD;  Location: [facility];  Service: Endoscopy;  Laterality: N/A;   HERNIA REPAIR  03/20/1970   inguinal   LYMPH NODE BIOPSY  03/21/1971   benign    Prior to Admission medications   Medication Sig Start Date End Date Taking? Authorizing Provider  aluminum-magnesium hydroxide 200-200 MG/5ML suspension Take 20 mLs by mouth every 6 (six) hours as needed for indigestion.   Yes [provider]  amLODipine (NORVASC) 5 MG tablet TAKE 1 TABLET BY MOUTH EVERY DAY for 90   Yes [provider]  carvedilol (COREG) 6.25 MG tablet Take 6.25 mg by mouth 2 (two) times daily with a meal.   Yes [provider]  cholecalciferol (VITAMIN D) 1000 UNITS tablet Take  1,000 Units by mouth daily.   Yes [provider]  cholecalciferol (VITAMIN D3) 25 MCG (1000 UNIT) tablet 1 tablet Orally Once a day   Yes [provider]  fexofenadine (ALLEGRA) 180 MG tablet Take 180 mg by mouth daily.   Yes [provider]  L-Lysine 500 MG TABS Take 1 tablet by mouth daily.   Yes [provider]  losartan-hydrochlorothiazide (HYZAAR) 100-12.5 MG tablet Take 1 tablet by mouth daily. 04/28/21  Yes [provider]  MULTIPLE VITAMIN PO Take 1 tablet by mouth daily.   Yes [provider]  omeprazole (PRILOSEC) 40 MG capsule Take 40 mg by mouth daily. 12/25/22  Yes [provider]  rosuvastatin (CRESTOR) 5 MG tablet Take 1 tablet by mouth daily. 02/26/19  Yes [provider]  triamcinolone (NASACORT ALLERGY 24HR) 55 MCG/ACT AERO nasal inhaler Place 1 spray into the nose daily.   Yes [provider]  [place] injection  11/10/22   [provider]    Current Outpatient Medications  Medication Sig Dispense Refill   aluminum-magnesium hydroxide 200-200 MG/5ML suspension Take 20 mLs by mouth every 6 (six) hours as needed for indigestion.     amLODipine (NORVASC) 5 MG tablet TAKE 1 TABLET BY MOUTH EVERY DAY for 90     carvedilol (COREG) 6.25 MG tablet Take 6.25 mg by mouth 2 (two) times daily with a meal.     cholecalciferol (VITAMIN D) 1000  UNITS tablet Take 1,000 Units by mouth daily.     cholecalciferol (VITAMIN D3) 25 MCG (1000 UNIT) tablet 1 tablet Orally Once a day     fexofenadine (ALLEGRA) 180 MG tablet Take 180 mg by mouth daily.     L-Lysine 500 MG TABS Take 1 tablet by mouth daily.     losartan-hydrochlorothiazide (HYZAAR) 100-12.5 MG tablet Take 1 tablet by mouth daily.     MULTIPLE VITAMIN PO Take 1 tablet by mouth daily.     omeprazole (PRILOSEC) 40 MG capsule Take 40 mg by mouth daily.     rosuvastatin (CRESTOR) 5 MG tablet Take 1 tablet by mouth daily.     triamcinolone (NASACORT  ALLERGY 24HR) 55 MCG/ACT AERO nasal inhaler Place 1 spray into the nose daily.     SHINGRIX injection      Current Facility-Administered Medications  Medication Dose Route Frequency Provider Last Rate Last Admin   0.9 %  sodium chloride infusion  500 mL Intravenous Continuous Imogene Burn, MD        Allergies as of 01/17/2023 - Review Complete 01/17/2023  Allergen Reaction Noted   Codeine Nausea Only 10/21/2014   Demerol [meperidine] Nausea Only 11/09/2014   Ampicillin Rash 11/09/2014   Doxycycline Rash 11/09/2014   Penicillins Rash 11/09/2014    Family History  Problem Relation Age of Onset   Breast cancer Mother    Colon polyps Mother    Heart disease Mother    Congestive Heart Failure Mother    Lung cancer Father        smoker but had quit years before   Colon cancer Neg Hx    Esophageal cancer Neg Hx    Rectal cancer Neg Hx    Stomach cancer Neg Hx     Social History   Socioeconomic History   Marital status: Married    Spouse name: Boyd Kerbs   Number of children: 2   Years of education: Not on file   Highest education level: Not on file  Occupational History   Not on file  Tobacco Use   Smoking status: Never   Smokeless tobacco: Never  Vaping Use   Vaping status: Never Used  Substance and Sexual Activity   Alcohol use: No   Drug use: No   Sexual activity: Not on file  Other Topics Concern   Not on file  Social History Narrative   Not on file   Social Determinants of Health   Financial Resource Strain: Not on file  Food Insecurity: Not on file  Transportation Needs: Not on file  Physical Activity: Not on file  Stress: Not on file  Social Connections: Not on file  Intimate Partner Violence: Not on file    Physical Exam: Vital signs in last 24 hours: BP (!) 157/99   Pulse 70   Temp 98.4 F (36.9 C) (Temporal)   Resp 10   Ht 5\' 7"  (1.702 m)   Wt 198 lb (89.8 kg)   SpO2 99%   BMI 31.01 kg/m  GEN: NAD EYE: Sclerae anicteric ENT: MMM CV:  Non-tachycardic Pulm: No increased WOB GI: Soft NEURO:  Alert & Oriented   Eulah Pont, MD [facility]   01/17/2023 12:59 PM

## 2023-01-17 NOTE — Patient Instructions (Signed)
Discharged instructions given. Handout on Dilatation diet. Resume previous medications. YOU HAD AN ENDOSCOPIC PROCEDURE TODAY AT THE [facility]:   Refer to the procedure report that was given to you for any specific questions about what was found during the examination.  If the procedure report does not answer your questions, please call your gastroenterologist to clarify.  If you requested that your care partner not be given the details of your procedure findings, then the procedure report has been included in a sealed envelope for you to review at your convenience later.  YOU SHOULD EXPECT: Some feelings of bloating in the abdomen. Passage of more gas than usual.  Walking can help get rid of the air that was put into your GI tract during the procedure and reduce the bloating. If you had a lower endoscopy (such as a colonoscopy or flexible sigmoidoscopy) you may notice spotting of blood in your stool or on the toilet paper. If you underwent a bowel prep for your procedure, you may not have a normal bowel movement for a few days.  Please Note:  You might notice some irritation and congestion in your nose or some drainage.  This is from the oxygen used during your procedure.  There is no need for concern and it should clear up in a day or so.  SYMPTOMS TO REPORT IMMEDIATELY:   Following upper endoscopy (EGD)  Vomiting of blood or coffee ground material  New chest pain or pain under the shoulder blades  Painful or persistently difficult swallowing  New shortness of breath  Fever of 100F or higher  Black, tarry-looking stools  For urgent or emergent issues, a gastroenterologist can be reached at any hour by calling [phone]. Do not use MyChart messaging for urgent concerns.    DIET:  We do recommend a small meal at first, but then you may proceed to your regular diet.  Drink plenty of fluids but you should avoid alcoholic beverages for 24 hours.  ACTIVITY:  You should plan  to take it easy for the rest of today and you should NOT DRIVE or use heavy machinery until tomorrow (because of the sedation medicines used during the test).    FOLLOW UP: Our staff will call the number listed on your records the next business day following your procedure.  We will call around 7:15- 8:00 am to check on you and address any questions or concerns that you may have regarding the information given to you following your procedure. If we do not reach you, we will leave a message.     If any biopsies were taken you will be contacted by phone or by letter within the next 1-3 weeks.  Please call us at [phone] if you have not heard about the biopsies in 3 weeks.    SIGNATURES/CONFIDENTIALITY: You and/or your care partner have signed paperwork which will be entered into your electronic medical record.  These signatures attest to the fact that that the information above on your After Visit Summary has been reviewed and is understood.  Full responsibility of the confidentiality of this discharge information lies with you and/or your care-partner.

## 2023-01-17 NOTE — Progress Notes (Signed)
Called to room to assist during endoscopic procedure.  Patient ID and intended procedure confirmed with present staff. Received instructions for my participation in the procedure from the performing physician.  

## 2023-01-17 NOTE — Progress Notes (Signed)
Sedate, gd SR, tolerated procedure well, VSS, report to RN 

## 2023-01-17 NOTE — Op Note (Addendum)
[facility] Patient Name: Frederick Weaver Procedure Date: 01/17/2023 11:44 AM MRN: [id number] Endoscopist: Madelyn Brunner [place] , , [id number] Age: 72 Referring MD:  Date of Birth: [birth year] Gender: Male Account #: [id number] Procedure:                Upper GI endoscopy Indications:              Dysphagia, Heartburn Medicines:                Monitored Anesthesia Care Procedure:                Pre-Anesthesia Assessment:                           - Prior to the procedure, a History and Physical                            was performed, and patient medications and                            allergies were reviewed. The patient's tolerance of                            previous anesthesia was also reviewed. The risks                            and benefits of the procedure and the sedation                            options and risks were discussed with the patient.                            All questions were answered, and informed consent                            was obtained. Prior Anticoagulants: The patient has                            taken no anticoagulant or antiplatelet agents. ASA                            Grade Assessment: II - A patient with mild systemic                            disease. After reviewing the risks and benefits,                            the patient was deemed in satisfactory condition to                            undergo the procedure.                           After obtaining informed consent, the endoscope was  passed under direct vision. Throughout the                            procedure, the patient's blood pressure, pulse, and                            oxygen saturations were monitored continuously. The                            Olympus scope [id number] was introduced through the                            mouth, and advanced to the second part of duodenum.                            The upper GI endoscopy was  accomplished without                            difficulty. The patient tolerated the procedure                            well. Scope In: Scope Out: Findings:                 One benign-appearing, intrinsic moderate                            (circumferential scarring or stenosis; an endoscope                            may pass) stenosis was found at the                            gastroesophageal junction. This stenosis measured                            less than one cm (in length). The stenosis was                            traversed. A TTS dilator was passed through the                            scope. Dilation with an 18-19-20 mm balloon dilator                            was performed to 20 mm.                           Biopsies were taken with a cold forceps in the                            proximal esophagus and in the distal esophagus for                            histology.  12 5 to 12 mm sessile polyps with no bleeding and                            no stigmata of recent bleeding were found in the                            gastric body. These polyps were removed with a cold                            snare. Resection and retrieval were complete.                           Localized inflammation characterized by congestion                            (edema) and erythema was found in the gastric                            antrum. Biopsies were taken with a cold forceps for                            histology.                           A single 8 mm sessile polyp with no bleeding was                            found in the ampulla. Biopsies were taken with a                            cold forceps for histology. Complications:            No immediate complications. Estimated Blood Loss:     Estimated blood loss was minimal. Impression:               - Benign-appearing esophageal stenosis. Dilated.                           - 12 gastric polyps.  Resected and retrieved.                           - Gastritis. Biopsied.                           - A single duodenal polyp. Biopsied.                           - Biopsies were taken with a cold forceps for                            histology in the proximal esophagus and in the                            distal esophagus. Recommendation:           -  Discharge patient to home (with escort).                           - Await pathology results.                           - Trial of omeprazole 40 mg BID for 8 weeks, then                            QD.                           - Return to GI clinic in 2-3 months.                           - The findings and recommendations were discussed                            with the patient. Dr Particia Lather "Alan Ripper" Leonides Schanz,  01/17/2023 1:35:23 PM

## 2023-01-18 ENCOUNTER — Telehealth: Payer: Self-pay

## 2023-01-18 NOTE — Telephone Encounter (Signed)
  Follow up Call-     01/17/2023   12:07 PM  Call back number  Post procedure Call Back phone  # [phone]  Permission to leave phone message Yes     Patient questions:  Do you have a fever, pain , or abdominal swelling? No. Pain Score  0 *  Have you tolerated food without any problems? Yes.    Have you been able to return to your normal activities? Yes.    Do you have any questions about your discharge instructions: Diet   No. Medications  No. Follow up visit  No.  Do you have questions or concerns about your Care? No.  Actions: * If pain score is 4 or above: No action needed, pain <4.

## 2023-01-22 ENCOUNTER — Encounter: Payer: Self-pay | Admitting: Internal Medicine

## 2023-01-22 LAB — SURGICAL PATHOLOGY

## 2023-01-31 DIAGNOSIS — L538 Other specified erythematous conditions: Secondary | ICD-10-CM | POA: Diagnosis not present

## 2023-01-31 DIAGNOSIS — L820 Inflamed seborrheic keratosis: Secondary | ICD-10-CM | POA: Diagnosis not present

## 2023-02-16 DIAGNOSIS — J018 Other acute sinusitis: Secondary | ICD-10-CM | POA: Diagnosis not present

## 2023-03-02 DIAGNOSIS — K22719 Barrett's esophagus with dysplasia, unspecified: Secondary | ICD-10-CM | POA: Diagnosis not present

## 2023-03-02 DIAGNOSIS — R1319 Other dysphagia: Secondary | ICD-10-CM | POA: Diagnosis not present

## 2023-03-02 DIAGNOSIS — R053 Chronic cough: Secondary | ICD-10-CM | POA: Diagnosis not present

## 2023-03-02 DIAGNOSIS — I10 Essential (primary) hypertension: Secondary | ICD-10-CM | POA: Diagnosis not present

## 2023-03-13 DIAGNOSIS — J018 Other acute sinusitis: Secondary | ICD-10-CM | POA: Diagnosis not present

## 2023-03-13 DIAGNOSIS — R053 Chronic cough: Secondary | ICD-10-CM | POA: Diagnosis not present

## 2023-03-17 DIAGNOSIS — R051 Acute cough: Secondary | ICD-10-CM | POA: Diagnosis not present

## 2023-03-20 ENCOUNTER — Other Ambulatory Visit: Payer: Self-pay | Admitting: Internal Medicine

## 2023-03-20 DIAGNOSIS — K2970 Gastritis, unspecified, without bleeding: Secondary | ICD-10-CM

## 2023-04-30 DIAGNOSIS — R0981 Nasal congestion: Secondary | ICD-10-CM | POA: Diagnosis not present

## 2023-05-02 ENCOUNTER — Ambulatory Visit (INDEPENDENT_AMBULATORY_CARE_PROVIDER_SITE_OTHER): Payer: Medicare HMO | Admitting: Internal Medicine

## 2023-05-02 ENCOUNTER — Encounter: Payer: Self-pay | Admitting: Internal Medicine

## 2023-05-02 VITALS — BP 130/80 | HR 88 | Ht 68.0 in | Wt 201.0 lb

## 2023-05-02 DIAGNOSIS — K219 Gastro-esophageal reflux disease without esophagitis: Secondary | ICD-10-CM

## 2023-05-02 DIAGNOSIS — Z8719 Personal history of other diseases of the digestive system: Secondary | ICD-10-CM

## 2023-05-02 DIAGNOSIS — K2970 Gastritis, unspecified, without bleeding: Secondary | ICD-10-CM

## 2023-05-02 DIAGNOSIS — K317 Polyp of stomach and duodenum: Secondary | ICD-10-CM

## 2023-05-02 DIAGNOSIS — K2270 Barrett's esophagus without dysplasia: Secondary | ICD-10-CM | POA: Diagnosis not present

## 2023-05-02 MED ORDER — OMEPRAZOLE 40 MG PO CPDR: 40.0000 mg | DELAYED_RELEASE_CAPSULE | Freq: Every day | ORAL | 3 refills | Status: DC

## 2023-05-02 NOTE — Patient Instructions (Signed)
_______________________________________________________  If your blood pressure at your visit was 140/90 or greater, please contact your primary care physician to follow up on this.  _______________________________________________________  If you are age 72 or older, your body mass index should be between 23-30. Your Body mass index is 30.56 kg/m. If this is out of the aforementioned range listed, please consider follow up with your Primary Care Provider.  If you are age 61 or younger, your body mass index should be between 19-25. Your Body mass index is 30.56 kg/m. If this is out of the aformentioned range listed, please consider follow up with your Primary Care Provider.   ________________________________________________________  The [facility] providers would like to encourage you to use [facility] to communicate with providers for non-urgent requests or questions.  Due to long hold times on the telephone, sending your provider a message by [facility] may be a faster and more efficient way to get a response.  Please allow 48 business hours for a response.  Please remember that this is for non-urgent requests.  _______________________________________________________  We have sent the following medications to your pharmacy for you to pick up at your convenience: Omeprazole  Please purchase the following medications over the counter and take as directed: Pepcid as needed  Repeat EGD for 03-2024. Please call 2 months prior to schedule this. A letter will be sent as it gets closer.

## 2023-05-02 NOTE — Progress Notes (Unsigned)
Chief Complaint: Dysphagia  HPI : 73 year old male with history of osteoarthritis, obesity, OSA, and GERD presents with dysphagia  Back in 08/2022, he was in [facility] and ate some BBQ. He then felt choked up due to the [facility] and had to eventually throw it up. During his dysphagia episode, he was scared that he might die. A couple of times since that episode, he has had a sensation of gas in his chest. When this gas sensation happens, he gets worried that he will get choked up again. He has been taking Mylanta, which has helped with his gas sensation. He was recently seen by his PCP for dysphagia and was started on omeprazole 40 mg every day. He used to be on Pepcid but would still have breakthrough reflux. He has been on PPI for 2 weeks, and he is not sure if it is helping. The Mylanta has helped more than the PPI in his opinion. He eats slower now than he did in the past. Has not had any overt dysphagia since 08/2022.  Denies N&V since that episode as well. Denies weight loss, ab pain, blood in the stools, diarrhea, or constipation. Denies prior EGD. Last colonoscopy was in 2016 that was normal. Father had GERD. Denies other family history of GI issues. Denies use of blood thinners. His labs with his PCP have been normal.  Interval History: Dysphagia has improved. Dilation helped. He is on omeprazole once a day in the morning. By the end of the day, he is taking some Mylanta at night time. He takes Mylanta at night time.   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   Hyperlipidemia    Hypertension    Seasonal allergies      Past Surgical History:  Procedure Laterality Date   COLONOSCOPY WITH PROPOFOL N/A 11/09/2014   Procedure:  COLONOSCOPY WITH PROPOFOL;  Surgeon: Charolett Bumpers, MD;  Location: [facility];  Service: Endoscopy;  Laterality: N/A;   HERNIA REPAIR  03/20/1970   inguinal   LYMPH NODE BIOPSY  03/21/1971   benign   Family History  Problem Relation Age of Onset   Breast cancer Mother    Colon polyps Mother    Heart disease Mother    Congestive Heart Failure Mother    Lung cancer Father        smoker but had quit years before   Colon cancer Neg Hx    Esophageal cancer Neg Hx    Rectal cancer Neg Hx    Stomach cancer Neg Hx    Social History   Tobacco Use   Smoking status: Never   Smokeless tobacco: Never  Vaping Use   Vaping status: Never Used  Substance Use Topics   Alcohol use: No   Drug use: No   Current Outpatient Medications  Medication Sig Dispense Refill   aluminum-magnesium hydroxide 200-200 MG/5ML suspension Take 20 mLs by mouth every 6 (six) hours as needed for indigestion.     amLODipine (NORVASC) 5 MG tablet TAKE 1 TABLET BY MOUTH EVERY DAY for 90     carvedilol (COREG) 6.25 MG tablet Take 6.25 mg by mouth  2 (two) times daily with a meal.     cholecalciferol (VITAMIN D) 1000 UNITS tablet Take 1,000 Units by mouth daily.     cholecalciferol (VITAMIN D3) 25 MCG (1000 UNIT) tablet 1 tablet Orally Once a day     fexofenadine (ALLEGRA) 180 MG tablet Take 180 mg by mouth daily.     L-Lysine 500 MG TABS Take 1 tablet by mouth daily.     losartan-hydrochlorothiazide (HYZAAR) 100-12.5 MG tablet Take 1 tablet by mouth daily.     MULTIPLE VITAMIN PO Take 1 tablet by mouth daily.     omeprazole (PRILOSEC) 40 MG capsule Take 1 capsule (40 mg total) by mouth daily. 60 capsule 1   rosuvastatin (CRESTOR) 5 MG tablet Take 1 tablet by mouth daily.     SHINGRIX injection      triamcinolone (NASACORT ALLERGY 24HR) 55 MCG/ACT AERO nasal inhaler Place 1 spray into the nose daily.     No current facility-administered medications for this visit.   Allergies  Allergen Reactions   Codeine  Nausea Only   Demerol [Meperidine] Nausea Only   Ampicillin Rash   Doxycycline Rash   Penicillins Rash   Review of Systems: All systems reviewed and negative except where noted in HPI.   Physical Exam: BP 130/80   Pulse 88   Ht 5\' 8"  (1.727 m)   Wt 201 lb (91.2 kg)   BMI 30.56 kg/m  Constitutional: Pleasant,well-developed, male in no acute distress. HEENT: Normocephalic and atraumatic. Conjunctivae are normal. No scleral icterus. Cardiovascular: Normal rate, regular rhythm.  Pulmonary/chest: Effort normal and breath sounds normal. No wheezing, rales or rhonchi. Abdominal: Soft, nondistended, nontender. Bowel sounds active throughout. There are no masses palpable. No hepatomegaly. Extremities: No edema Neurological: Alert and oriented to person place and time. Skin: Skin is warm and dry. No rashes noted. Psychiatric: Normal mood and affect. Behavior is normal.  In 2005, colonoscopy performed with removal of a 2 mm adenomatous cecal polyp   10/22/2008 normal surveillance colonoscopy performed.   Had a normal colonoscopy in 2016  Colonoscopy 01/06/22: 8 4-6 colon polyps that were removed. Diverticulosis in the sigmoid, descending, transverse colon, and ascenidng colon. Repeat colonoscopy recommended in 3 years.  EGD 01/17/23: - One benign- appearing, intrinsic moderate ( circumferential scarring or stenosis; an endoscope may pass) stenosis was found at the gastroesophageal junction. This stenosis measured less than one cm ( in length) . The stenosis was traversed. A TTS dilator was passed through the scope. Dilation with an 18- 19- 20 mm balloon dilator was performed to 20 mm. - Biopsies were taken with a cold forceps in the proximal esophagus and in the distal esophagus for histology. - 12 5 to 12 mm sessile polyps with no bleeding and no stigmata of recent bleeding were found in the gastric body. These polyps were removed with a cold snare. Resection and retrieval were complete. -  Localized inflammation characterized by congestion ( edema) and erythema was found in the gastric antrum. Biopsies were taken with a cold forceps for histology. - A single 8 mm sessile polyp with no bleeding was found in the ampulla. Biopsies were taken with a cold forceps for histology. Path:  1. Surgical [P], ampulla polyp :      -  SMALL INTESTINAL MUCOSA WITH PROMINENT FOVEOLAR METAPLASIA AND      EROSION/ULCERATION SUGGESTIVE OF CHRONIC PEPTIC DUODENITIS, CLINICAL/ENDOSCOPIC      CORRELATION RECOMMENDED.      2. Surgical [P], gastric :      -  ANTRAL AND OXYNTIC MUCOSA WITH MILD CHEMICAL/REACTIVE CHANGE.      -  NO HELICOBACTER PYLORI ORGANISMS IDENTIFIED ON H&E STAINED SLIDE.      3. Surgical [P], gastric polyps, polyp (12) :      -  MIXED FUNDIC GLAND AND HYPERPLASTIC POLYPS (PREDOMINANTLY FUNDIC GLAND) WITH      EVIDENCE OF SUBMUCOSAL INVOLVEMENT (I.E.GASTRITIS CYSTICA POLYPOSA).      -  NEGATIVE FOR DYSPLASIA.      4. Surgical [P], esophagus :      -  SQUAMOCOLUMNAR JUNCTIONAL MUCOSA WITH FOCAL INTESTINAL METAPLASIA CONSISTENT      WITH BARRETT'S ESOPHAGUS IN THE APPROPRIATE ENDOSCOPIC SETTING.      -  NEGATIVE FOR DYSPLASIA.   ASSESSMENT AND PLAN: Dysphagia - resolved GERD Barrett's esophagus History of hyperplastic stomach polyps Patient presents with a severe episode of dysphagia in 08/2022, which eventually resolved with vomiting.  Since then, patient has continued to experience a gas sensation in his chest that worsens with eating certain foods such as pizza and improves with use of Mylanta therapy.  Patient's gas sensation may be a manifestation of acid reflux so I went over reflux friendly diet as well as conservative treatment options for reflux today.  Patient can continue his PPI therapy and Mylanta as needed.  Will plan for EGD for further evaluation of a source of his dysphagia. - Previously gave GERD hand out - Continue omeprazole 40 mg every day and Mylanta PRN. Could  consider replacing Mylanta with Pepcid at bedtime. Will refill omeprazole - Next EGD in 03/2024 per patient request (pushed back from 12/2022) - Next colonoscopy due in 2026 for colon cancer screening  Eulah Pont, MD  I spent 45 minutes of time, including in depth chart review, independent review of results as outlined above, communicating results with the patient directly, face-to-face time with the patient, coordinating care, ordering studies and medications as appropriate, and documentation.

## 2023-05-07 ENCOUNTER — Other Ambulatory Visit: Payer: Self-pay

## 2023-05-07 ENCOUNTER — Telehealth: Payer: Self-pay | Admitting: Internal Medicine

## 2023-05-07 DIAGNOSIS — K2970 Gastritis, unspecified, without bleeding: Secondary | ICD-10-CM

## 2023-05-07 DIAGNOSIS — R0981 Nasal congestion: Secondary | ICD-10-CM | POA: Diagnosis not present

## 2023-05-07 DIAGNOSIS — K219 Gastro-esophageal reflux disease without esophagitis: Secondary | ICD-10-CM

## 2023-05-07 DIAGNOSIS — I10 Essential (primary) hypertension: Secondary | ICD-10-CM | POA: Diagnosis not present

## 2023-05-07 DIAGNOSIS — R053 Chronic cough: Secondary | ICD-10-CM | POA: Diagnosis not present

## 2023-05-07 DIAGNOSIS — K2270 Barrett's esophagus without dysplasia: Secondary | ICD-10-CM

## 2023-05-07 DIAGNOSIS — J018 Other acute sinusitis: Secondary | ICD-10-CM | POA: Diagnosis not present

## 2023-05-07 MED ORDER — PANTOPRAZOLE SODIUM 40 MG PO TBEC: 40.0000 mg | DELAYED_RELEASE_TABLET | Freq: Every day | ORAL | 3 refills | Status: DC

## 2023-05-07 NOTE — Telephone Encounter (Signed)
Inbound call from patient, would like a call from a nurse in regards to all of his medication management.

## 2023-05-08 DIAGNOSIS — T485X5A Adverse effect of other anti-common-cold drugs, initial encounter: Secondary | ICD-10-CM | POA: Diagnosis not present

## 2023-05-08 DIAGNOSIS — L538 Other specified erythematous conditions: Secondary | ICD-10-CM | POA: Diagnosis not present

## 2023-05-08 DIAGNOSIS — J310 Chronic rhinitis: Secondary | ICD-10-CM | POA: Diagnosis not present

## 2023-05-08 DIAGNOSIS — D225 Melanocytic nevi of trunk: Secondary | ICD-10-CM | POA: Diagnosis not present

## 2023-05-08 DIAGNOSIS — L821 Other seborrheic keratosis: Secondary | ICD-10-CM | POA: Diagnosis not present

## 2023-05-08 DIAGNOSIS — L570 Actinic keratosis: Secondary | ICD-10-CM | POA: Diagnosis not present

## 2023-05-08 DIAGNOSIS — L814 Other melanin hyperpigmentation: Secondary | ICD-10-CM | POA: Diagnosis not present

## 2023-05-08 DIAGNOSIS — L820 Inflamed seborrheic keratosis: Secondary | ICD-10-CM | POA: Diagnosis not present

## 2023-05-08 DIAGNOSIS — Z08 Encounter for follow-up examination after completed treatment for malignant neoplasm: Secondary | ICD-10-CM | POA: Diagnosis not present

## 2023-05-08 DIAGNOSIS — Z85820 Personal history of malignant melanoma of skin: Secondary | ICD-10-CM | POA: Diagnosis not present

## 2023-05-22 DIAGNOSIS — J328 Other chronic sinusitis: Secondary | ICD-10-CM | POA: Diagnosis not present

## 2023-05-22 DIAGNOSIS — I10 Essential (primary) hypertension: Secondary | ICD-10-CM | POA: Diagnosis not present

## 2023-05-25 DIAGNOSIS — H5213 Myopia, bilateral: Secondary | ICD-10-CM | POA: Diagnosis not present

## 2023-05-25 DIAGNOSIS — H524 Presbyopia: Secondary | ICD-10-CM | POA: Diagnosis not present

## 2023-05-25 DIAGNOSIS — R0981 Nasal congestion: Secondary | ICD-10-CM | POA: Diagnosis not present

## 2023-05-25 DIAGNOSIS — K219 Gastro-esophageal reflux disease without esophagitis: Secondary | ICD-10-CM | POA: Diagnosis not present

## 2023-05-25 DIAGNOSIS — I10 Essential (primary) hypertension: Secondary | ICD-10-CM | POA: Diagnosis not present

## 2023-05-25 DIAGNOSIS — E6609 Other obesity due to excess calories: Secondary | ICD-10-CM | POA: Diagnosis not present

## 2023-05-25 DIAGNOSIS — R7303 Prediabetes: Secondary | ICD-10-CM | POA: Diagnosis not present

## 2023-05-25 DIAGNOSIS — H52209 Unspecified astigmatism, unspecified eye: Secondary | ICD-10-CM | POA: Diagnosis not present

## 2023-05-29 DIAGNOSIS — J309 Allergic rhinitis, unspecified: Secondary | ICD-10-CM | POA: Diagnosis not present

## 2023-05-30 ENCOUNTER — Telehealth: Payer: Self-pay | Admitting: Internal Medicine

## 2023-05-30 DIAGNOSIS — K219 Gastro-esophageal reflux disease without esophagitis: Secondary | ICD-10-CM

## 2023-05-30 MED ORDER — PANTOPRAZOLE SODIUM 40 MG PO TBEC: 40.0000 mg | DELAYED_RELEASE_TABLET | Freq: Two times a day (BID) | ORAL | 1 refills | Status: DC

## 2023-05-30 NOTE — Telephone Encounter (Signed)
 Patient called stating that his pharmacy will not refill his proscription for Protonix because they do not have the authorization. Patient is requesting we send information over to them. Please advise.

## 2023-05-30 NOTE — Telephone Encounter (Signed)
 Pt made aware of Dr. Leonides Schanz recommendations: Pt requested to set up an office visit. Pt was scheduled to see Dr. Leonides Schanz on 06/07/2023 at 11:10 AM.   Pt made aware. Pt verbalized understanding with all questions answered.

## 2023-05-30 NOTE — Telephone Encounter (Signed)
 Pt stated that he had an EGD with dilation on 01/17/2023 and had been feeling good until the last several weeks. Pt stated that he had an office visit recently on 05/02/2023 with Dr. Leonides Schanz and at the time of the office visit that he was feeling well. Pt stated that over the last several weeks that he feels that when he is swallowing now that things are getting tight and he is having to burp more often. Pt stated that he ate eggs this AM and even swallowing them that things did not feel right. Pt stated that he is starting to get scared every time that his does eat.  Please review and advise

## 2023-05-30 NOTE — Telephone Encounter (Signed)
 Could increase his omeprazole to 40 mg BID as a trial to see if uncontrolled acid reflux is contributing to his symptoms. If this is not effective, then we could consider a repeat EGD.

## 2023-05-30 NOTE — Telephone Encounter (Signed)
 Pt stated that he called his pharmacy and that they would not refill the prescription. Chart reviewed and prescription placed for twice a day.  Pt made aware. Pt verbalized understanding with all questions answered.

## 2023-05-30 NOTE — Telephone Encounter (Signed)
 Inbound call from patient stating is he is still having difficulty swallowing. Patient states he is unsure if it is due to gas. Patient is requesting a call back. Please advise, thank you.

## 2023-05-31 ENCOUNTER — Other Ambulatory Visit: Payer: Self-pay

## 2023-05-31 DIAGNOSIS — K219 Gastro-esophageal reflux disease without esophagitis: Secondary | ICD-10-CM

## 2023-05-31 MED ORDER — PANTOPRAZOLE SODIUM 40 MG PO TBEC: 40.0000 mg | DELAYED_RELEASE_TABLET | Freq: Two times a day (BID) | ORAL | 1 refills | Status: DC

## 2023-06-06 DIAGNOSIS — I10 Essential (primary) hypertension: Secondary | ICD-10-CM | POA: Diagnosis not present

## 2023-06-06 DIAGNOSIS — J328 Other chronic sinusitis: Secondary | ICD-10-CM | POA: Diagnosis not present

## 2023-06-06 DIAGNOSIS — F419 Anxiety disorder, unspecified: Secondary | ICD-10-CM | POA: Diagnosis not present

## 2023-06-07 ENCOUNTER — Telehealth: Payer: Self-pay

## 2023-06-07 ENCOUNTER — Encounter: Payer: Self-pay | Admitting: Internal Medicine

## 2023-06-07 ENCOUNTER — Telehealth: Payer: Self-pay | Admitting: Internal Medicine

## 2023-06-07 ENCOUNTER — Other Ambulatory Visit (HOSPITAL_COMMUNITY): Payer: Self-pay

## 2023-06-07 ENCOUNTER — Other Ambulatory Visit: Payer: Self-pay | Admitting: Internal Medicine

## 2023-06-07 ENCOUNTER — Ambulatory Visit: Admitting: Internal Medicine

## 2023-06-07 VITALS — BP 130/62 | HR 83 | Ht 68.0 in | Wt 198.0 lb

## 2023-06-07 DIAGNOSIS — K219 Gastro-esophageal reflux disease without esophagitis: Secondary | ICD-10-CM

## 2023-06-07 DIAGNOSIS — K2270 Barrett's esophagus without dysplasia: Secondary | ICD-10-CM | POA: Diagnosis not present

## 2023-06-07 DIAGNOSIS — R1310 Dysphagia, unspecified: Secondary | ICD-10-CM | POA: Diagnosis not present

## 2023-06-07 DIAGNOSIS — K317 Polyp of stomach and duodenum: Secondary | ICD-10-CM | POA: Diagnosis not present

## 2023-06-07 MED ORDER — VOQUEZNA 10 MG PO TABS: 10.0000 mg | ORAL_TABLET | Freq: Every day | ORAL | 0 refills | Status: DC

## 2023-06-07 NOTE — Telephone Encounter (Signed)
 Patient called and stated that he was was notified that Voquezna was not cover by his insurance. Patient is wanting to know what are his next's steps. Patient is requesting a call back. Please advise.

## 2023-06-07 NOTE — Progress Notes (Unsigned)
 Chief Complaint: Dysphagia  HPI : 73 year old male with history of osteoarthritis, obesity, OSA, and GERD presents for follow up of dysphagia  Interval History: He started having dysphagia again starting about 2-3 weeks ago. He has not been able to swallow his food well. He is just eating cereal and soup that is already in small pieces. He tried eating some [employer] pieces and it did not feel like they wanted to go down. He has had more burping and gas. He is currently taking his pantoprazole 40 mg once a day. His cruise is scheduled 09/2023 so he is worried about being able to go on the cruise with his ongoing dysphagia issues. 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   Hyperlipidemia    Hypertension    Seasonal allergies      Past Surgical History:  Procedure Laterality Date   COLONOSCOPY WITH PROPOFOL N/A 11/09/2014   Procedure: COLONOSCOPY WITH PROPOFOL;  Surgeon: Charolett Bumpers, MD;  Location: [facility];  Service: Endoscopy;  Laterality: N/A;   HERNIA REPAIR  03/20/1970   inguinal   LYMPH NODE BIOPSY  03/21/1971   benign   Family History  Problem Relation Age of Onset   Breast cancer Mother    Colon polyps Mother    Heart disease Mother    Congestive Heart Failure Mother    Lung cancer Father        smoker but had quit years before   Colon cancer Neg Hx    Esophageal cancer Neg Hx    Rectal cancer Neg Hx    Stomach cancer Neg Hx    Social History   Tobacco Use   Smoking status: Never   Smokeless tobacco: Never  Vaping Use   Vaping status: Never Used  Substance Use Topics   Alcohol use: No   Drug use: No   Current Outpatient Medications   Medication Sig Dispense Refill   aluminum-magnesium hydroxide 200-200 MG/5ML suspension Take 20 mLs by mouth every 6 (six) hours as needed for indigestion.     cholecalciferol (VITAMIN D3) 25 MCG (1000 UNIT) tablet 1 tablet Orally Once a day     fexofenadine (ALLEGRA) 180 MG tablet Take 180 mg by mouth daily.     losartan-hydrochlorothiazide (HYZAAR) 100-12.5 MG tablet Take 1 tablet by mouth daily.     metoprolol tartrate (LOPRESSOR) 50 MG tablet 1 tablet with food Orally Twice a day for 90 days     busPIRone (BUSPAR) 5 MG tablet 1 tablet Orally Twice a day for 30 days (Patient not taking: Reported on 06/07/2023)     L-Lysine 500 MG TABS Take 1 tablet by mouth daily. (Patient not taking: Reported on 06/07/2023)     MULTIPLE VITAMIN PO Take 1 tablet by mouth daily. (Patient not taking: Reported on 06/07/2023)     SHINGRIX injection      Vonoprazan Fumarate (VOQUEZNA) 10 MG TABS Take 10 mg by mouth daily. 30 tablet 0   No current facility-administered medications for this visit.   Allergies  Allergen Reactions   Codeine Nausea  Only   Demerol [Meperidine] Nausea Only   Ampicillin Rash   Doxycycline Rash   Penicillins Rash   Physical Exam: BP 130/62   Pulse 83   Ht 5\' 8"  (1.727 m)   Wt 198 lb (89.8 kg)   BMI 30.11 kg/m  Constitutional: Pleasant,well-developed, male in no acute distress. HEENT: Normocephalic and atraumatic. Conjunctivae are normal. No scleral icterus. Cardiovascular: Normal rate, regular rhythm.  Pulmonary/chest: Effort normal and breath sounds normal. No wheezing, rales or rhonchi. Abdominal: Soft, nondistended, nontender. Bowel sounds active throughout. There are no masses palpable. No hepatomegaly. Extremities: No edema Neurological: Alert and oriented to person place and time. Skin: Skin is warm and dry. No rashes noted. Psychiatric: Normal mood and affect. Behavior is normal.  In 2005, colonoscopy performed with removal of a 2 mm adenomatous cecal polyp    10/22/2008 normal surveillance colonoscopy performed.   Had a normal colonoscopy in 2016  Colonoscopy 01/06/22: 8 4-6 colon polyps that were removed. Diverticulosis in the sigmoid, descending, transverse colon, and ascenidng colon. Repeat colonoscopy recommended in 3 years.  EGD 01/17/23: - One benign- appearing, intrinsic moderate ( circumferential scarring or stenosis; an endoscope may pass) stenosis was found at the gastroesophageal junction. This stenosis measured less than one cm ( in length) . The stenosis was traversed. A TTS dilator was passed through the scope. Dilation with an 18- 19- 20 mm balloon dilator was performed to 20 mm. - Biopsies were taken with a cold forceps in the proximal esophagus and in the distal esophagus for histology. - 12 5 to 12 mm sessile polyps with no bleeding and no stigmata of recent bleeding were found in the gastric body. These polyps were removed with a cold snare. Resection and retrieval were complete. - Localized inflammation characterized by congestion ( edema) and erythema was found in the gastric antrum. Biopsies were taken with a cold forceps for histology. - A single 8 mm sessile polyp with no bleeding was found in the ampulla. Biopsies were taken with a cold forceps for histology. Path:  1. Surgical [P], ampulla polyp :      -  SMALL INTESTINAL MUCOSA WITH PROMINENT FOVEOLAR METAPLASIA AND      EROSION/ULCERATION SUGGESTIVE OF CHRONIC PEPTIC DUODENITIS, CLINICAL/ENDOSCOPIC      CORRELATION RECOMMENDED.      2. Surgical [P], gastric :      -  ANTRAL AND OXYNTIC MUCOSA WITH MILD CHEMICAL/REACTIVE CHANGE.      -  NO HELICOBACTER PYLORI ORGANISMS IDENTIFIED ON H&E STAINED SLIDE.      3. Surgical [P], gastric polyps, polyp (12) :      -  MIXED FUNDIC GLAND AND HYPERPLASTIC POLYPS (PREDOMINANTLY FUNDIC GLAND) WITH      EVIDENCE OF SUBMUCOSAL INVOLVEMENT (I.E.GASTRITIS CYSTICA POLYPOSA).      -  NEGATIVE FOR DYSPLASIA.      4. Surgical [P], esophagus  :      -  SQUAMOCOLUMNAR JUNCTIONAL MUCOSA WITH FOCAL INTESTINAL METAPLASIA CONSISTENT      WITH BARRETT'S ESOPHAGUS IN THE APPROPRIATE ENDOSCOPIC SETTING.      -  NEGATIVE FOR DYSPLASIA.   ASSESSMENT AND PLAN: Dysphagia GERD Weight loss Barrett's esophagus History of hyperplastic stomach polyps Patient had improvement in his dysphagia after esophageal dilation in 12/2022, but unfortunately, he has had a recurrence in his dysphagia starting about 2-3 weeks ago. He is currently restricting his diet due to his issues with dysphagia. He also still has some uncontrolled reflux but cannot tolerate taking higher  dose of his PPI therapy. Thus will trial him on Voquezna if we are able to get this approved with his insurance. Will plan for another EGD for repeat dilation, to look for any additional stomach polyps, and to further evaluate for Barrett's esophagus. Since patient's symptoms have recurred relatively quickly, will also plan for barium swallow and esophageal manometry. Since patient has had some weight loss with his dysphagia, I encouraged him to use nutritional supplements for nutritional support for now. I do wonder if anxiety may also be significantly contributing to his symptoms - Previously gave GERD hand out - Start Voquezna 10 mg QD - Stop Protonix - EGD LEC with dilation - Barium esophagram  - Esophageal manometry  - Encouraged him to use nutritional supplements - Next colonoscopy due in 2026 for colon cancer screening  Eulah Pont, MD  I spent 37 minutes of time, including in depth chart review, independent review of results as outlined above, communicating results with the patient directly, face-to-face time with the patient, coordinating care, ordering studies and medications as appropriate, and documentation.

## 2023-06-07 NOTE — Telephone Encounter (Signed)
-----   Message from Imogene Burn sent at 06/07/2023 12:47 PM EDT ----- Let's start a prior auth on Voquezna for this patient please. Thanks

## 2023-06-07 NOTE — Telephone Encounter (Signed)
 PA request has been Submitted. New Encounter has been or will be created for follow up. For additional info see Pharmacy Prior Auth telephone encounter from 06-07-2023.

## 2023-06-07 NOTE — Telephone Encounter (Signed)
 Pharmacy Patient Advocate Encounter  Received notification from [facility] that Prior Authorization for  Voquezna 10MG  tablets has been DENIED.  Full denial letter will be uploaded to the media tab. See denial reason below.  Your prescriber must provide information that documents at least one of the following has occurred:  -You have tried the formulary drugs for the treatment of your condition and they did not work for you. OR -The formulary drugs could cause adverse effects. OR -The formulary drugs would be less effective for your condition than the requested drug.   Alternatives available:  Rabeprazole sodium DR tablet  Lansoprazole DR capsule Esomeprazole magnesium DR capsule Dexlansoprazole DR capsule  PA #/Case ID/Reference #: [id number]

## 2023-06-07 NOTE — Telephone Encounter (Signed)
 Pharmacy Patient Advocate Encounter   Received notification from Pt Calls Messages that prior authorization for Voquezna 10MG  tablets is required/requested.   Insurance verification completed.   The patient is insured through [facility] .   Per test claim: PA required; PA submitted to above mentioned insurance via CoverMyMeds Key/confirmation #/EOC [id number] Status is pending

## 2023-06-07 NOTE — Patient Instructions (Signed)
 You have been scheduled for a Barium Esophogram at [facility] (1st floor of the hospital) on 06/21/23 at 9 am. Please arrive 30 minutes prior to your appointment for registration. Make certain not to have anything to eat or drink 3 hours prior to your test. If you need to reschedule for any reason, please contact radiology at [phone] to do so. __________________________________________________________________ A barium swallow is an examination that concentrates on views of the esophagus. This tends to be a double contrast exam (barium and two liquids which, when combined, create a gas to distend the wall of the oesophagus) or single contrast (non-ionic iodine based). The study is usually tailored to your symptoms so a good history is essential. Attention is paid during the study to the form, structure and configuration of the esophagus, looking for functional disorders (such as aspiration, dysphagia, achalasia, motility and reflux) EXAMINATION You may be asked to change into a gown, depending on the type of swallow being performed. A radiologist and radiographer will perform the procedure. The radiologist will advise you of the type of contrast selected for your procedure and direct you during the exam. You will be asked to stand, sit or lie in several different positions and to hold a small amount of fluid in your mouth before being asked to swallow while the imaging is performed .In some instances you may be asked to swallow barium coated marshmallows to assess the motility of a solid food bolus. The exam can be recorded as a digital or video fluoroscopy procedure. POST PROCEDURE It will take 1-2 days for the barium to pass through your system. To facilitate this, it is important, unless otherwise directed, to increase your fluids for the next 24-48hrs and to resume your normal diet.  This test typically takes about 30 minutes to perform.  You have been scheduled for an esophageal  manometry test at [facility] on 11/07/23 at 8:30 am. Please arrive 30 minutes prior to your procedure for registration. You will need to go to outpatient registration (1st floor of the hospital) first. Make certain to bring your insurance cards as well as a complete list of medications.  Please remember the following:  1) Do not take any muscle relaxants, xanax (alprazolam) or ativan for 1 day prior to your test as well as the day of the test.  2) Nothing to eat or drink after 12:00 midnight on the night before your test.  3) Hold all diabetic medications/insulin the morning of the test. You may eat and take your medications after the test.  It will take at least 2 weeks to receive the results of this test from your physician.  ------------------------------------------ ABOUT ESOPHAGEAL MANOMETRY Esophageal manometry (muh-NOM-uh-tree) is a test that gauges how well your esophagus works. Your esophagus is the long, muscular tube that connects your throat to your stomach. Esophageal manometry measures the rhythmic muscle contractions (peristalsis) that occur in your esophagus when you swallow. Esophageal manometry also measures the coordination and force exerted by the muscles of your esophagus.  During esophageal manometry, a thin, flexible tube (catheter) that contains sensors is passed through your nose, down your esophagus and into your stomach. Esophageal manometry can be helpful in diagnosing some mostly uncommon disorders that affect your esophagus.  Why it's done Esophageal manometry is used to evaluate the movement (motility) of food through the esophagus and into the stomach. The test measures how well the circular bands of muscle (sphincters) at the top and bottom of your esophagus  open and close, as well as the pressure, strength and pattern of the wave of esophageal muscle contractions that moves food along.  What you can expect Esophageal manometry is an outpatient procedure  done without sedation. Most people tolerate it well. You may be asked to change into a hospital gown before the test starts.  During esophageal manometry  While you are sitting up, a member of your health care team sprays your throat with a numbing medication or puts numbing gel in your nose or both.  A catheter is guided through your nose into your esophagus. The catheter may be sheathed in a water-filled sleeve. It doesn't interfere with your breathing. However, your eyes may water, and you may gag. You may have a slight nosebleed from irritation.  After the catheter is in place, you may be asked to lie on your back on an exam table, or you may be asked to remain seated.  You then swallow small sips of water. As you do, a computer connected to the catheter records the pressure, strength and pattern of your esophageal muscle contractions.  During the test, you'll be asked to breathe slowly and smoothly, remain as still as possible, and swallow only when you're asked to do so.  A member of your health care team may move the catheter down into your stomach while the catheter continues its measurements.  The catheter then is slowly withdrawn. The test usually lasts 20 to 30 minutes.  After esophageal manometry  When your esophageal manometry is complete, you may return to your normal activities  This test typically takes 30-45 minutes to complete...   You have been scheduled for an endoscopy. Please follow written instructions given to you at your visit today.  If you use inhalers (even only as needed), please bring them with you on the day of your procedure.  If you take any of the following medications, they will need to be adjusted prior to your procedure:   DO NOT TAKE 7 DAYS PRIOR TO TEST- Trulicity (dulaglutide) Ozempic, Wegovy (semaglutide) Mounjaro (tirzepatide) Bydureon Bcise (exanatide extended release)  DO NOT TAKE 1 DAY PRIOR TO YOUR TEST Rybelsus (semaglutide) Adlyxin  (lixisenatide) Victoza (liraglutide) Byetta (exanatide) ___________________________________________________________________________    We have sent the following medications to your pharmacy for you to pick up at your convenience: Voquezna  _______________________________________________________  If your blood pressure at your visit was 140/90 or greater, please contact your primary care physician to follow up on this.  _______________________________________________________  If you are age 75 or older, your body mass index should be between 23-30. Your Body mass index is 30.11 kg/m. If this is out of the aforementioned range listed, please consider follow up with your Primary Care Provider.  If you are age 36 or younger, your body mass index should be between 19-25. Your Body mass index is 30.11 kg/m. If this is out of the aformentioned range listed, please consider follow up with your Primary Care Provider.   ________________________________________________________  The [place] [facility] providers would like to encourage you to use [facility] to communicate with providers for non-urgent requests or questions.  Due to long hold times on the telephone, sending your provider a message by [facility] may be a faster and more efficient way to get a response.  Please allow 48 business hours for a response.  Please remember that this is for non-urgent requests.  _______________________________________________________  ________________________________________________________________________________

## 2023-06-08 ENCOUNTER — Other Ambulatory Visit: Payer: Self-pay

## 2023-06-08 MED ORDER — ESOMEPRAZOLE MAGNESIUM 40 MG PO CPDR: 40.0000 mg | DELAYED_RELEASE_CAPSULE | Freq: Every day | ORAL | 2 refills | Status: DC

## 2023-06-08 NOTE — Telephone Encounter (Signed)
 Patient made aware of denial & MD recommendations. Prescription sent to pharmacy. Pt advised to call back if symptoms are not improving or has any questions/concerns. Pt verbalized all understanding.

## 2023-06-09 ENCOUNTER — Encounter: Payer: Self-pay | Admitting: Internal Medicine

## 2023-06-11 ENCOUNTER — Telehealth: Payer: Self-pay | Admitting: Internal Medicine

## 2023-06-11 ENCOUNTER — Encounter: Payer: Self-pay | Admitting: Internal Medicine

## 2023-06-11 ENCOUNTER — Other Ambulatory Visit: Payer: Self-pay

## 2023-06-11 DIAGNOSIS — K2270 Barrett's esophagus without dysplasia: Secondary | ICD-10-CM

## 2023-06-11 DIAGNOSIS — F419 Anxiety disorder, unspecified: Secondary | ICD-10-CM | POA: Diagnosis not present

## 2023-06-11 DIAGNOSIS — I10 Essential (primary) hypertension: Secondary | ICD-10-CM | POA: Diagnosis not present

## 2023-06-11 DIAGNOSIS — K219 Gastro-esophageal reflux disease without esophagitis: Secondary | ICD-10-CM | POA: Diagnosis not present

## 2023-06-11 DIAGNOSIS — J328 Other chronic sinusitis: Secondary | ICD-10-CM | POA: Diagnosis not present

## 2023-06-11 DIAGNOSIS — R350 Frequency of micturition: Secondary | ICD-10-CM | POA: Diagnosis not present

## 2023-06-11 MED ORDER — VOQUEZNA 10 MG PO TABS: 10.0000 mg | ORAL_TABLET | Freq: Every day | ORAL | 0 refills | Status: DC

## 2023-06-11 NOTE — Telephone Encounter (Signed)
 Pt stated that the esomeprazole is causing him severe abdominal cramps along with increase urination. Pt states that he cannot take the medication. Pt is requesting that he be prescribed the Voquezna again and that they try to get it approved by the insurance again. Pt request that prescription be sent to [facility] on [facility]. Please review and advise

## 2023-06-11 NOTE — Telephone Encounter (Signed)
 This note was forwarded to me 3/20.  Frederick Weaver is currently working on PA in another message.

## 2023-06-11 NOTE — Telephone Encounter (Signed)
 Chart notes from today reviewed and noted.     Pt made aware of Dr. Leonides Schanz recommendations: Prescription sent to pharmacy as requested. Pt made aware. Pt was made aware that this was going to be sent to our PA team.  Please assist with PA     Leonides Schanz Orlie Dakin, MD to Emeline Darling, RN     06/11/23 12:56 PM Okay we can try to appeal Voquezna now that he has failed Nexium and see if it will be approved.   06/11/23  8:45 AM Emeline Darling, RN routed this conversation to Imogene Burn, MD Emeline Darling, RN     06/11/23  8:45 AM Note Pt stated that the esomeprazole is causing him severe abdominal cramps along with increase urination. Pt states that he cannot take the medication. Pt is requesting that he be prescribed the Voquezna again and that they try to get it approved by the insurance again. Pt request that prescription be sent to [facility] on [facility]. Please review and advise          06/11/23  8:06 AM Marjo Bicker, [place] routed this conversation to [employer] B Triage  Jesus Genera  VD   06/11/23  8:05 AM Note Inbound call from patient wishing to speak further about esomeprazole medication. States he has been having stomach cramps and urinating more than usual since starting medication. Patient is requesting a call back. Please advise, thank you.

## 2023-06-11 NOTE — Telephone Encounter (Signed)
 Pt made aware of Dr. Leonides Schanz recommendations: Prescription sent to pharmacy as requested. Pt made aware. Pt was made aware that this was going to be sent to our PA team.  Please assist with PA

## 2023-06-11 NOTE — Telephone Encounter (Signed)
 Inbound call from patient wishing to speak further about esomeprazole medication. States he has been having stomach cramps and urinating more than usual since starting medication. Patient is requesting a call back. Please advise, thank you.

## 2023-06-12 ENCOUNTER — Telehealth: Payer: Self-pay | Admitting: Pharmacist

## 2023-06-12 NOTE — Telephone Encounter (Signed)
 E-Appeal has been submitted for Voquezna. Will advise when response is received, please be advised that most companies may take 30 days to make a decision. Appeal letter and supporting documentation have been uploaded and submitted via [employer] website on 06/12/2023.  Thank you, Dellie Burns, PharmD Clinical Pharmacist  [place]  Direct Dial: [phone]

## 2023-06-12 NOTE — Telephone Encounter (Signed)
 Pt made aware. Pt verbalized understanding with all questions answered.

## 2023-06-12 NOTE — Telephone Encounter (Signed)
 Tried to resubmit but states that it requires an appeal due to previous denial. Our pharmacist is aware.   Information has been sent to clinical pharmacist for appeals review. It may take 5-7 days to prepare the necessary documentation to request the appeal from the insurance.

## 2023-06-12 NOTE — Telephone Encounter (Signed)
 The appeal for Voquezna has been approved by the insurance:

## 2023-06-13 ENCOUNTER — Telehealth: Payer: Self-pay | Admitting: Internal Medicine

## 2023-06-13 NOTE — Telephone Encounter (Signed)
 Pt made aware. Pt verbalized understanding with all questions answered.

## 2023-06-13 NOTE — Telephone Encounter (Signed)
 Pt stated that he recently has gone to his PCP and Urologist. Urologist notified pt that his increased urination was coming from his enlarged prostate.

## 2023-06-13 NOTE — Telephone Encounter (Signed)
 Patient called and stated that he started taking VOQUEZNA and was experiencing Dizziness and heart palpations. Patient stated that he would like to go over this with Dr. Leonides Schanz and talk about going back to pantoprazole. Patient is requesting a call back. Please advise.

## 2023-06-13 NOTE — Telephone Encounter (Signed)
 Pt questioned if the Voquenza had a "Water Pill" associated with it. All questions were answered for the pt. Pt verbalized understanding with all questions answered.

## 2023-06-13 NOTE — Telephone Encounter (Signed)
 Pt stated that he had dizziness and heart palpations this morning after taking the Voquezna. Pt stated that the symptoms have resolved and he is feeling fine now.  Pt was notified to discontinue taking the medication due to the side effects. Pt stated that he wanted to try the Protonix again and also try some probiotics. Pt stated that he went to his Urologist yesterday and was notified that the increase in urination was not from the PPI but due to an enlarged prostate.  Pt previously scheduled for an EGD on 06/19/2023 with Dr Leonides Schanz Pt previously scheduled for a Follow up office visit with Dr. Leonides Schanz on 08/29/2023 at 10:10 AM.  Routed as Lorain Childes

## 2023-06-13 NOTE — Telephone Encounter (Signed)
 Patient called would like to know if he can take the Voquenza with his BP medication.

## 2023-06-15 NOTE — Telephone Encounter (Signed)
 Pt stated that he had questions about his medications prior to his procedure. All questions answered. Pt verbalized understanding with all questions answered.

## 2023-06-15 NOTE — Telephone Encounter (Signed)
 Patient called would like to get clarification on weather or not he can take his blood pressure medication prior to his procedure.

## 2023-06-15 NOTE — Telephone Encounter (Signed)
 Left message for pt to call back

## 2023-06-15 NOTE — Telephone Encounter (Signed)
 Patient returning phone call. Please advise, thank you.

## 2023-06-16 DIAGNOSIS — I10 Essential (primary) hypertension: Secondary | ICD-10-CM | POA: Diagnosis not present

## 2023-06-16 DIAGNOSIS — R002 Palpitations: Secondary | ICD-10-CM | POA: Diagnosis not present

## 2023-06-16 DIAGNOSIS — T887XXA Unspecified adverse effect of drug or medicament, initial encounter: Secondary | ICD-10-CM | POA: Diagnosis not present

## 2023-06-18 ENCOUNTER — Telehealth: Payer: Self-pay | Admitting: Internal Medicine

## 2023-06-18 NOTE — Telephone Encounter (Signed)
 Inbound call from patient stating insurance has not received prior auth for tomorrow's 4/1 procedure. Requesting a call back. Please advise, thank you.

## 2023-06-18 NOTE — Telephone Encounter (Signed)
 Patient called again stating that his insurance has not received a prior authorization for his colonoscopy procedure is on April 1 st. Patient is needing a call back today if possible. Please advise .

## 2023-06-18 NOTE — Telephone Encounter (Signed)
 Patient call regarding prior auth for 4/1 colonoscopy. Please advise, thank you.

## 2023-06-19 ENCOUNTER — Encounter: Payer: Self-pay | Admitting: Internal Medicine

## 2023-06-19 ENCOUNTER — Ambulatory Visit (AMBULATORY_SURGERY_CENTER): Admitting: Internal Medicine

## 2023-06-19 VITALS — BP 111/64 | HR 71 | Temp 95.0°F | Resp 14 | Ht 68.0 in | Wt 198.0 lb

## 2023-06-19 DIAGNOSIS — R1310 Dysphagia, unspecified: Secondary | ICD-10-CM | POA: Diagnosis not present

## 2023-06-19 DIAGNOSIS — K222 Esophageal obstruction: Secondary | ICD-10-CM | POA: Diagnosis not present

## 2023-06-19 DIAGNOSIS — K2270 Barrett's esophagus without dysplasia: Secondary | ICD-10-CM | POA: Diagnosis not present

## 2023-06-19 DIAGNOSIS — E785 Hyperlipidemia, unspecified: Secondary | ICD-10-CM | POA: Diagnosis not present

## 2023-06-19 DIAGNOSIS — I10 Essential (primary) hypertension: Secondary | ICD-10-CM | POA: Diagnosis not present

## 2023-06-19 DIAGNOSIS — K219 Gastro-esophageal reflux disease without esophagitis: Secondary | ICD-10-CM

## 2023-06-19 DIAGNOSIS — K317 Polyp of stomach and duodenum: Secondary | ICD-10-CM

## 2023-06-19 MED ORDER — SODIUM CHLORIDE 0.9 % IV SOLN: 500.0000 mL | Freq: Once | INTRAVENOUS | Status: DC

## 2023-06-19 NOTE — Progress Notes (Signed)
 Pt's states no medical or surgical changes since previsit or office visit.

## 2023-06-19 NOTE — Progress Notes (Signed)
 GASTROENTEROLOGY PROCEDURE H&P NOTE   Primary Care Physician: Emilio Aspen, MD    Reason for Procedure:   Dysphagia, Barrett's esophagus  Plan:    EGD  Patient is appropriate for endoscopic procedure(s) in the ambulatory ([facility]) setting.  The nature of the procedure, as well as the risks, benefits, and alternatives were carefully and thoroughly reviewed with the patient. Ample time for discussion and questions allowed. The patient understood, was satisfied, and agreed to proceed.     HPI: Frederick Weaver. is a 73 y.o. male who presents for EGD for evaluation of dysphagia, Barrett's esophagus.  Patient was most recently seen in the Gastroenterology Clinic on 06/07/23.  No interval change in medical history since that appointment. Please refer to that note for full details regarding GI history and clinical presentation.   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   Hyperlipidemia    Hypertension    Seasonal allergies     Past Surgical History:  Procedure Laterality Date   COLONOSCOPY WITH PROPOFOL N/A 11/09/2014   Procedure: COLONOSCOPY WITH PROPOFOL;  Surgeon: Charolett Bumpers, MD;  Location: [facility];  Service: Endoscopy;  Laterality: N/A;   HERNIA REPAIR  03/20/1970   inguinal   LYMPH NODE BIOPSY  03/21/1971   benign    Prior to Admission medications   Medication Sig Start Date End Date Taking? Authorizing Provider  losartan-hydrochlorothiazide (HYZAAR) 100-12.5 MG tablet Take 1 tablet by mouth daily. 04/28/21  Yes [provider]  metoprolol tartrate (LOPRESSOR) 50 MG tablet 1 tablet with food Orally Twice a day for 90 days 06/06/23  Yes [provider]  pantoprazole (PROTONIX) 40 MG tablet 1 tablet 1/2 to 1 hour before morning meal Orally Once a day 05/09/23  Yes [provider]   aluminum-magnesium hydroxide 200-200 MG/5ML suspension Take 20 mLs by mouth every 6 (six) hours as needed for indigestion.    [provider]  busPIRone (BUSPAR) 5 MG tablet 1 tablet Orally Twice a day for 30 days Patient not taking: Reported on 06/07/2023 06/06/23   [provider]  cholecalciferol (VITAMIN D3) 25 MCG (1000 UNIT) tablet 1 tablet Orally Once a day    [provider]  fexofenadine (ALLEGRA) 180 MG tablet Take 180 mg by mouth daily.    [provider]  L-Lysine 500 MG TABS Take 1 tablet by mouth daily. Patient not taking: Reported on 06/07/2023    [provider]  MULTIPLE VITAMIN PO Take 1 tablet by mouth daily. Patient not taking: Reported on 06/07/2023    [provider]  [facility] injection  11/10/22   [provider]  Vonoprazan Fumarate (VOQUEZNA) 10 MG TABS Take 10 mg by mouth daily. 06/11/23   Imogene Burn, MD    Current Outpatient Medications  Medication Sig Dispense Refill   cholecalciferol (VITAMIN D3) 25 MCG (1000 UNIT) tablet 1 tablet Orally Once a day     fexofenadine (ALLEGRA) 180 MG tablet Take 180 mg by mouth daily.     losartan-hydrochlorothiazide (HYZAAR) 100-12.5 MG tablet Take 1 tablet by mouth daily.     metoprolol succinate (TOPROL-XL) 50 MG 24 hr tablet 1 tablet Orally Once a day for 30 day(s)     pantoprazole (PROTONIX) 40 MG tablet 1 tablet 1/2 to 1 hour  before morning meal Orally Once a day     aluminum-magnesium hydroxide 200-200 MG/5ML suspension Take 20 mLs by mouth every 6 (six) hours as needed for indigestion.     busPIRone (BUSPAR) 5 MG tablet 1 tablet Orally Twice a day for 30 days (Patient not taking: Reported on 06/19/2023)     L-Lysine 500 MG TABS Take 1 tablet by mouth daily. (Patient not taking: Reported on 06/19/2023)     metoprolol tartrate (LOPRESSOR) 50 MG tablet 1 tablet with food Orally Twice a day for 90 days (Patient not taking: Reported on 06/19/2023)     MULTIPLE VITAMIN PO  Take 1 tablet by mouth daily. (Patient not taking: Reported on 06/07/2023)     rosuvastatin (CRESTOR) 5 MG tablet TAKE 1 TABLET BY MOUTH EVERY DAY for 90     SHINGRIX injection      Vonoprazan Fumarate (VOQUEZNA) 10 MG TABS Take 10 mg by mouth daily. (Patient not taking: Reported on 06/19/2023) 30 tablet 0   Current Facility-Administered Medications  Medication Dose Route Frequency Provider Last Rate Last Admin   0.9 %  sodium chloride infusion  500 mL Intravenous Once Imogene Burn, MD        Allergies as of 06/19/2023 - Review Complete 06/19/2023  Allergen Reaction Noted   Ampicillin Rash 11/09/2014   Codeine Nausea Only 10/21/2014   Demerol [meperidine] Nausea Only 11/09/2014   Doxycycline Rash 11/09/2014   Penicillins Rash 11/09/2014    Family History  Problem Relation Age of Onset   Breast cancer Mother    Colon polyps Mother    Heart disease Mother    Congestive Heart Failure Mother    Lung cancer Father        smoker but had quit years before   Colon cancer Neg Hx    Esophageal cancer Neg Hx    Rectal cancer Neg Hx    Stomach cancer Neg Hx     Social History   Socioeconomic History   Marital status: Married    Spouse name: Boyd Kerbs   Number of children: 2   Years of education: Not on file   Highest education level: Not on file  Occupational History   Not on file  Tobacco Use   Smoking status: Never   Smokeless tobacco: Never  Vaping Use   Vaping status: Never Used  Substance and Sexual Activity   Alcohol use: No   Drug use: No   Sexual activity: Not on file  Other Topics Concern   Not on file  Social History Narrative   Not on file   Social Drivers of Health   Financial Resource Strain: Not on file  Food Insecurity: Not on file  Transportation Needs: Not on file  Physical Activity: Not on file  Stress: Not on file  Social Connections: Not on file  Intimate Partner Violence: Not on file    Physical Exam: Vital signs in last 24 hours: BP 139/83    Pulse 99   Temp (!) 95 F (35 C)   Ht 5\' 8"  (1.727 m)   Wt 198 lb (89.8 kg)   SpO2 95%   BMI 30.11 kg/m  GEN: NAD EYE: Sclerae anicteric ENT: MMM CV: Non-tachycardic Pulm: No increased WOB GI: Soft NEURO:  Alert & Oriented   Eulah Pont, MD [facility]   06/19/2023 9:43 AM

## 2023-06-19 NOTE — Patient Instructions (Signed)
 -Handout on gastric polyps  provided -await pathology results -Continue present medications   YOU HAD AN ENDOSCOPIC PROCEDURE TODAY AT THE [facility]:   Refer to the procedure report that was given to you for any specific questions about what was found during the examination.  If the procedure report does not answer your questions, please call your gastroenterologist to clarify.  If you requested that your care partner not be given the details of your procedure findings, then the procedure report has been included in a sealed envelope for you to review at your convenience later.  YOU SHOULD EXPECT: Some feelings of bloating in the abdomen. Passage of more gas than usual.  Walking can help get rid of the air that was put into your GI tract during the procedure and reduce the bloating. If you had a lower endoscopy (such as a colonoscopy or flexible sigmoidoscopy) you may notice spotting of blood in your stool or on the toilet paper. If you underwent a bowel prep for your procedure, you may not have a normal bowel movement for a few days.  Please Note:  You might notice some irritation and congestion in your nose or some drainage.  This is from the oxygen used during your procedure.  There is no need for concern and it should clear up in a day or so.  SYMPTOMS TO REPORT IMMEDIATELY:  Following upper endoscopy (EGD)  Vomiting of blood or coffee ground material  New chest pain or pain under the shoulder blades  Painful or persistently difficult swallowing  New shortness of breath  Fever of 100F or higher  Black, tarry-looking stools  For urgent or emergent issues, a gastroenterologist can be reached at any hour by calling [phone]. Do not use MyChart messaging for urgent concerns.    DIET:  We do recommend a small meal at first, but then you may proceed to your regular diet.  Drink plenty of fluids but you should avoid alcoholic beverages for 24 hours.  ACTIVITY:  You should  plan to take it easy for the rest of today and you should NOT DRIVE or use heavy machinery until tomorrow (because of the sedation medicines used during the test).    FOLLOW UP: Our staff will call the number listed on your records the next business day following your procedure.  We will call around 7:15- 8:00 am to check on you and address any questions or concerns that you may have regarding the information given to you following your procedure. If we do not reach you, we will leave a message.     If any biopsies were taken you will be contacted by phone or by letter within the next 1-3 weeks.  Please call us at [phone] if you have not heard about the biopsies in 3 weeks.    SIGNATURES/CONFIDENTIALITY: You and/or your care partner have signed paperwork which will be entered into your electronic medical record.  These signatures attest to the fact that that the information above on your After Visit Summary has been reviewed and is understood.  Full responsibility of the confidentiality of this discharge information lies with you and/or your care-partner.  Stomach Polyps  .A stomach polyp, also called a gastric polyp, is a growth on the lining of the stomach. A stomach polyp may be found by chance when you are being examined for another reason. Most polyps are not dangerous, but some can be harmful because of their size, location, or type. Polyps that  can become harmful include: Large polyps. These can turn into open sores called ulcers. Ulcers can lead to stomach bleeding. Gastric outlet obstructions. These polyps block food from moving from the stomach to the small intestine. Adenomas. These polyps can become cancerous. What are the causes? Stomach polyps form when the lining of the stomach gets inflamed or damaged. Stomach inflammation and damage may be caused by: A long-lasting stomach condition, such as gastritis. Taking certain medicines to reduce stomach acid over a long period of  time. An inherited condition called familial adenomatous polyposis. An infection caused by a type of bacteria called Helicobacter pylori, or H. pylori. What are the signs or symptoms? Usually, this condition does not cause any symptoms. If you do have symptoms, they may include: Pain or tenderness in the abdomen. Nausea. Trouble eating or swallowing. Blood in the stool (feces). Dark red or black coloring of the stool. How is this diagnosed? This condition may be diagnosed with: Your medical history. A medical procedure called an upper endoscopy. This procedure uses a flexible tube with a tiny camera on the end to look inside your stomach. A lab test in which a part of the polyp is examined. This test is done by removing a polyp tissue sample during an endoscopy to look at it under a microscope (biopsy). How is this treated? Treatment depends on the type, location, and size of the polyps. Treatment may include: Checking the polyps regularly with an endoscopy procedure. Removing the polyps with an endoscopy procedure. This may be done if the polyps are harmful or can become harmful. Removing a polyp often prevents problems from developing. Removing the polyps with a surgery called a partial gastrectomy. This may be done in rare cases to remove very large polyps. Treating the underlying condition that caused the polyps. Follow these instructions at home:  Take over-the-counter and prescription medicines only as told by your health care provider. Avoid foods and drinks that make your symptoms worse. Make sure that all the food you eat is properly cooked and stored. This will help prevent bacterial infection, such as H. pylori infection. Do not drink alcohol if your health care provider tells you not to drink. Do not use any products that contain nicotine or tobacco. These products include cigarettes, chewing tobacco, and vaping devices, such as e-cigarettes. If you need help quitting, ask your  health care provider. Keep all follow-up visits. Your health care provider will want to look for stomach polyps regularly. Contact a health care provider if: You develop new symptoms such as: Loss of appetite. Feeling full after eating a small meal. Vomiting. Losing weight without trying. Feeling tired (fatigue). Your symptoms get worse. You cannot eat or drink. You have questions about the tests or procedures you may need. Get help right away if: You vomit blood. You have severe abdominal pain. You have blood in your stool. Summary A stomach polyp, also called a gastric polyp, is a growth on the lining of the stomach. Usually, this condition does not cause any symptoms. If you do have symptoms, you may have abdominal pain, nausea, trouble eating or swallowing, or blood in your stool. This condition may be diagnosed with a medical procedure called an upper endoscopy. Get help right away if you vomit blood. This information is not intended to replace advice given to you by your health care provider. Make sure you discuss any questions you have with your health care provider. Document Revised: 05/03/2021 Document Reviewed: 05/03/2021 Elsevier Patient Education  2024 [employer] Inc[employer]

## 2023-06-19 NOTE — Progress Notes (Signed)
 Sedate, gd SR, tolerated procedure well, VSS, report to RN

## 2023-06-19 NOTE — Op Note (Signed)
 [facility] Patient Name: Frederick Weaver Procedure Date: 06/19/2023 9:43 AM MRN: [id number] Endoscopist: Madelyn Brunner Tina , , [id number] Age: 73 Referring MD:  Date of Birth: 11/09/1950 Gender: Male Account #: [id number] Procedure:                Upper GI endoscopy Indications:              Dysphagia, Follow-up of Barrett's esophagus,                            Follow-up of gastric polyps Medicines:                Monitored Anesthesia Care Procedure:                Pre-Anesthesia Assessment:                           - Prior to the procedure, a History and Physical                            was performed, and patient medications and                            allergies were reviewed. The patient's tolerance of                            previous anesthesia was also reviewed. The risks                            and benefits of the procedure and the sedation                            options and risks were discussed with the patient.                            All questions were answered, and informed consent                            was obtained. Prior Anticoagulants: The patient has                            taken no anticoagulant or antiplatelet agents. ASA                            Grade Assessment: II - A patient with mild systemic                            disease. After reviewing the risks and benefits,                            the patient was deemed in satisfactory condition to                            undergo the procedure.  After obtaining informed consent, the endoscope was                            passed under direct vision. Throughout the                            procedure, the patient's blood pressure, pulse, and                            oxygen saturations were monitored continuously. The                            GIF [id number] #[id number] was introduced through the                            mouth, and advanced to the second  part of duodenum.                            The upper GI endoscopy was accomplished without                            difficulty. The patient tolerated the procedure                            well. Scope In: Scope Out: Findings:                 One benign-appearing, intrinsic moderate                            (circumferential scarring or stenosis; an endoscope                            may pass) stenosis was found at the                            gastroesophageal junction. This stenosis measured                            less than one cm (in length). The stenosis was                            traversed. A TTS dilator was passed through the                            scope. Dilation with an 18-19-20 mm balloon dilator                            was performed to 20 mm. The dilation site was                            examined and showed mild mucosal disruption.                            Biopsies  were taken with a cold forceps for                            histology.                           Two 5 to 7 mm sessile polyps with no bleeding and                            no stigmata of recent bleeding were found in the                            gastric body. These polyps were removed with a cold                            snare. Resection and retrieval were complete.                           The examined duodenum was normal. Complications:            No immediate complications. Estimated Blood Loss:     Estimated blood loss was minimal. Impression:               - Benign-appearing esophageal stenosis. Dilated.                            Biopsied.                           - Two gastric polyps. Resected and retrieved.                           - Normal examined duodenum. Recommendation:           - Discharge patient to home (with escort).                           - Await pathology results.                           - Will follow up results of barium esophagram and                             esophageal manometry.                           - The findings and recommendations were discussed                            with the patient. Dr Particia Lather "Alan Ripper" Leonides Schanz,  06/19/2023 10:06:36 AM

## 2023-06-20 ENCOUNTER — Telehealth: Payer: Self-pay

## 2023-06-20 NOTE — Telephone Encounter (Signed)
  Follow up Call-     06/19/2023    9:17 AM 01/17/2023   12:07 PM  Call back number  Post procedure Call Back phone  # [phone]  Permission to leave phone message Yes Yes     Patient questions:  Do you have a fever, pain , or abdominal swelling? No. Pain Score  0 *  Have you tolerated food without any problems? Yes.    Have you been able to return to your normal activities? Yes.    Do you have any questions about your discharge instructions: Diet   No. Medications  No. Follow up visit  No.  Do you have questions or concerns about your Care? No.  Actions: * If pain score is 4 or above: No action needed, pain <4.

## 2023-06-21 ENCOUNTER — Other Ambulatory Visit: Payer: Self-pay | Admitting: Internal Medicine

## 2023-06-21 ENCOUNTER — Encounter: Payer: Self-pay | Admitting: Internal Medicine

## 2023-06-21 ENCOUNTER — Ambulatory Visit (HOSPITAL_COMMUNITY)
Admission: RE | Admit: 2023-06-21 | Discharge: 2023-06-21 | Disposition: A | Discharge status: Home / Self Care | Source: Ambulatory Visit | Attending: Internal Medicine | Admitting: Internal Medicine

## 2023-06-21 DIAGNOSIS — K2281 Esophageal polyp: Secondary | ICD-10-CM | POA: Diagnosis not present

## 2023-06-21 DIAGNOSIS — R1310 Dysphagia, unspecified: Secondary | ICD-10-CM | POA: Insufficient documentation

## 2023-06-21 DIAGNOSIS — K2270 Barrett's esophagus without dysplasia: Secondary | ICD-10-CM | POA: Diagnosis not present

## 2023-06-21 DIAGNOSIS — K219 Gastro-esophageal reflux disease without esophagitis: Secondary | ICD-10-CM

## 2023-06-21 DIAGNOSIS — K224 Dyskinesia of esophagus: Secondary | ICD-10-CM | POA: Diagnosis not present

## 2023-06-21 DIAGNOSIS — K222 Esophageal obstruction: Secondary | ICD-10-CM | POA: Diagnosis not present

## 2023-06-21 DIAGNOSIS — K317 Polyp of stomach and duodenum: Secondary | ICD-10-CM | POA: Insufficient documentation

## 2023-06-21 DIAGNOSIS — R002 Palpitations: Secondary | ICD-10-CM | POA: Diagnosis not present

## 2023-06-21 LAB — SURGICAL PATHOLOGY

## 2023-06-22 ENCOUNTER — Encounter: Payer: Self-pay | Admitting: Internal Medicine

## 2023-06-22 ENCOUNTER — Telehealth: Payer: Self-pay | Admitting: Internal Medicine

## 2023-06-22 ENCOUNTER — Other Ambulatory Visit: Payer: Self-pay

## 2023-06-22 DIAGNOSIS — R1310 Dysphagia, unspecified: Secondary | ICD-10-CM

## 2023-06-22 DIAGNOSIS — R002 Palpitations: Secondary | ICD-10-CM | POA: Diagnosis not present

## 2023-06-22 MED ORDER — BACLOFEN 5 MG PO TABS: 5.0000 mg | ORAL_TABLET | Freq: Two times a day (BID) | ORAL | 0 refills | Status: DC

## 2023-06-22 NOTE — Telephone Encounter (Signed)
 Refer to phone note 06/22/23.

## 2023-06-22 NOTE — Telephone Encounter (Signed)
 Inbound call from patient requesting a call to discuss the Baclofen 5 mg   medication  that Dr. Leonides Schanz prescribed him. He states he has questions regarding the medication. Please advise.

## 2023-06-22 NOTE — Telephone Encounter (Signed)
Prescription sent to pharmacy Pt made aware Pt verbalized understanding with all questions answered.   

## 2023-06-25 ENCOUNTER — Encounter: Payer: Self-pay | Admitting: Internal Medicine

## 2023-06-25 ENCOUNTER — Telehealth: Payer: Self-pay | Admitting: Internal Medicine

## 2023-06-25 NOTE — Telephone Encounter (Signed)
 Patient f/u on previous note. Please advise.

## 2023-06-25 NOTE — Telephone Encounter (Signed)
 Patient called and stated that he would like to speak to the nurse regarding his recent prescription for Baclofen. Patient Is requesting a call back.Please advise.

## 2023-06-27 NOTE — Telephone Encounter (Signed)
 Spoke with pt.  Pt questioned side effects of medication.  All questions answered. Pt verbalized understanding with all questions answered.

## 2023-06-27 NOTE — Telephone Encounter (Signed)
 Spoke with pt.   Documented in my chart message. Pt verbalized understanding with all questions answered.

## 2023-06-27 NOTE — Telephone Encounter (Signed)
 Pt made aware of Dr. Leonides Schanz recommendations: Pt was notified that he has been rescheduled for the Esophageal Manometry to 10/24/2023 at 8:30.  Pt stated that he has an office visit tomorrow to see Deanna May NP at 3:00 PM.  Pt stated that he has a list of questions.

## 2023-06-28 ENCOUNTER — Encounter: Payer: Self-pay | Admitting: Gastroenterology

## 2023-06-28 ENCOUNTER — Ambulatory Visit: Admitting: Gastroenterology

## 2023-06-28 ENCOUNTER — Encounter: Admitting: Internal Medicine

## 2023-06-28 VITALS — BP 130/82 | HR 83 | Ht 68.0 in | Wt 186.0 lb

## 2023-06-28 DIAGNOSIS — R634 Abnormal weight loss: Secondary | ICD-10-CM | POA: Diagnosis not present

## 2023-06-28 DIAGNOSIS — K224 Dyskinesia of esophagus: Secondary | ICD-10-CM | POA: Diagnosis not present

## 2023-06-28 DIAGNOSIS — K2270 Barrett's esophagus without dysplasia: Secondary | ICD-10-CM

## 2023-06-28 DIAGNOSIS — R002 Palpitations: Secondary | ICD-10-CM | POA: Diagnosis not present

## 2023-06-28 DIAGNOSIS — K219 Gastro-esophageal reflux disease without esophagitis: Secondary | ICD-10-CM

## 2023-06-28 DIAGNOSIS — F419 Anxiety disorder, unspecified: Secondary | ICD-10-CM | POA: Diagnosis not present

## 2023-06-28 DIAGNOSIS — I10 Essential (primary) hypertension: Secondary | ICD-10-CM | POA: Diagnosis not present

## 2023-06-28 DIAGNOSIS — R1310 Dysphagia, unspecified: Secondary | ICD-10-CM | POA: Diagnosis not present

## 2023-06-28 DIAGNOSIS — J328 Other chronic sinusitis: Secondary | ICD-10-CM | POA: Diagnosis not present

## 2023-06-28 DIAGNOSIS — R1319 Other dysphagia: Secondary | ICD-10-CM | POA: Diagnosis not present

## 2023-06-28 NOTE — Progress Notes (Signed)
 Chief Complaint:follow-up dysphagia Primary GI Doctor:Dr. Leonides Schanz  HPI: 73 year old male with history of osteoarthritis, obesity, OSA, and GERD presents for follow up of dysphagia.   3/2/0/25 GI visit with Dr. Leonides Schanz patient had improvement in his dysphagia after esophageal dilation in 12/2022, but unfortunately, he has had a recurrence in his dysphagia early March.He is currently restricting his diet due to his issues with dysphagia. He also still has some uncontrolled reflux but cannot tolerate taking higher dose of his PPI therapy. Thus will trial him on Voquezna if we are able to get this approved with his insurance. Will plan for another EGD for repeat dilation, to look for any additional stomach polyps, and to further evaluate for Barrett's esophagus. Since patient's symptoms have recurred relatively quickly, will also plan for barium swallow and esophageal manometry. I do wonder if anxiety Hamsini Verrilli also be significantly contributing to his symptoms   Interval History   Patient presents for follow-up after EGD and barium esophagram with several questions. Patient has history of GERD complicated with Barretts and currently taking Pantoprazole 40 mg po daily. Patient was recently started on Voquezna 10 mg po daily at last appointment, but states he felt like it gave him heart palpitations so he discontinued it after a few days and went back to Pantoprazole 40 mg po daily.       Patient states the baclofen he was started on has caused constipation and urinary frequency. He is currently taking Doculax to go more regularly. He is only taking the baclofen once daily due to concerns with increased side effects taking more frequentyl. He cannot recall if it helps much. He does feel the dilatation Desmond Szabo have helped some, but it still feels like solids sit in his chest and slowly move into his stomach. He is scheduled on 10/24/23 for esophageal manometry, he is very anxious today about pursuing other alternative  treatment options. His diet consists of soup and oatmeal. He is afraid to eat regular food out of fear of choking. He expresses concerns with weight loss and waiting for manometry test. He states he is feeling depressed and overall his desire to eat has decreased. His PCP had offered medication but he had concerns about it interacting with his other medications so he did not take it. His wife at bedside states his anxiety has increased significantly over the last few months.  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   Hyperlipidemia    Hypertension    Seasonal allergies     Past Surgical History:  Procedure Laterality Date   COLONOSCOPY WITH PROPOFOL N/A 11/09/2014   Procedure: COLONOSCOPY WITH PROPOFOL;  Surgeon: Charolett Bumpers, MD;  Location: [facility];  Service: Endoscopy;  Laterality: N/A;   HERNIA REPAIR  03/20/1970   inguinal   LYMPH NODE BIOPSY  03/21/1971   benign    Current Outpatient Medications  Medication Sig Dispense Refill   aluminum-magnesium hydroxide 200-200 MG/5ML suspension Take 20 mLs by mouth every 6 (six) hours as needed for indigestion.     Baclofen 5 MG TABS Take 1 tablet (5 mg total) by mouth 2 (two) times daily  before a meal. 60 tablet 0   cholecalciferol (VITAMIN D3) 25 MCG (1000 UNIT) tablet 1 tablet Orally Once a day     fexofenadine (ALLEGRA) 180 MG tablet Take 180 mg by mouth daily.     losartan-hydrochlorothiazide (HYZAAR) 100-12.5 MG tablet Take 1 tablet by mouth daily.     metoprolol succinate (TOPROL-XL) 50 MG 24 hr tablet Take 25 mg by mouth daily. 1/2 tablet daily     MULTIPLE VITAMIN PO Take 1 tablet by mouth daily.     pantoprazole (PROTONIX) 40 MG tablet 1 tablet 1/2 to 1 hour  before morning meal Orally Once a day     SHINGRIX injection  (Patient not taking: Reported on 06/28/2023)     No current facility-administered medications for this visit.    Allergies as of 06/28/2023 - Review Complete 06/28/2023  Allergen Reaction Noted   Ampicillin Rash 11/09/2014   Codeine Nausea Only 10/21/2014   Demerol [meperidine] Nausea Only 11/09/2014   Doxycycline Rash 11/09/2014   Penicillins Rash 11/09/2014    Family History  Problem Relation Age of Onset   Breast cancer Mother    Colon polyps Mother    Heart disease Mother    Congestive Heart Failure Mother    Lung cancer Father        smoker but had quit years before   Colon cancer Neg Hx    Esophageal cancer Neg Hx    Rectal cancer Neg Hx    Stomach cancer Neg Hx     Review of Systems:    Constitutional: No weight loss, fever, chills, weakness or fatigue HEENT: Eyes: No change in vision               Ears, Nose, Throat:  No change in hearing or congestion Skin: No rash or itching Cardiovascular: No chest pain, chest pressure or palpitations   Respiratory: No SOB or cough Gastrointestinal: See HPI and otherwise negative Genitourinary: No dysuria or change in urinary frequency Neurological: No headache, dizziness or syncope Musculoskeletal: No new muscle or joint pain Hematologic: No bleeding or bruising Psychiatric: No history of depression or anxiety    Physical Exam:  Vital signs: BP 130/82   Pulse 83   Ht 5\' 8"  (1.727 m)   Wt 186 lb (84.4 kg)   BMI 28.28 kg/m   Constitutional:   Pleasant male appears to be in NAD, Well developed, Well nourished, alert and cooperative Throat: Oral cavity and pharynx without inflammation, swelling or lesion.  Respiratory: Respirations even and unlabored. Lungs clear to auscultation bilaterally.   No wheezes, crackles, or rhonchi.  Cardiovascular: Normal S1, S2. Regular rate and rhythm. No peripheral edema, cyanosis or pallor.  Gastrointestinal:  Soft,  nondistended, nontender. No rebound or guarding. Normal bowel sounds. No appreciable masses or hepatomegaly. Rectal:  Not performed.  Msk:  Symmetrical without gross deformities. Without edema, no deformity or joint abnormality.  Neurologic:  Alert and  oriented x4;  grossly normal neurologically.  Skin:   Dry and intact without significant lesions or rashes. Psychiatric: Oriented to person, place and time. Demonstrates good judgement and reason without abnormal affect or behaviors.  RELEVANT LABS AND IMAGING: In 2005, colonoscopy performed with removal of a 2 mm adenomatous cecal polyp    10/22/2008 normal surveillance colonoscopy performed.    Had a normal colonoscopy in 2016   Colonoscopy 01/06/22: 8 4-6 colon polyps that were removed. Diverticulosis in the sigmoid, descending, transverse colon, and ascenidng colon. Repeat colonoscopy recommended in 3 years.  EGD 01/17/23: - One benign- appearing, intrinsic moderate ( circumferential scarring or stenosis; an endoscope Keelin Neville pass) stenosis was found at the gastroesophageal junction. This stenosis measured less than one cm ( in length) . The stenosis was traversed. A TTS dilator was passed through the scope. Dilation with an 18- 19- 20 mm balloon dilator was performed to 20 mm. - Biopsies were taken with a cold forceps in the proximal esophagus and in the distal esophagus for histology. - 12 5 to 12 mm sessile polyps with no bleeding and no stigmata of recent bleeding were found in the gastric body. These polyps were removed with a cold snare. Resection and retrieval were complete. - Localized inflammation characterized by congestion ( edema) and erythema was found in the gastric antrum. Biopsies were taken with a cold forceps for histology. - A single 8 mm sessile polyp with no bleeding was found in the ampulla. Biopsies were taken with a cold forceps for histology. Path:  1. Surgical [P], ampulla polyp :      -  SMALL INTESTINAL MUCOSA WITH  PROMINENT FOVEOLAR METAPLASIA AND      EROSION/ULCERATION SUGGESTIVE OF CHRONIC PEPTIC DUODENITIS, CLINICAL/ENDOSCOPIC      CORRELATION RECOMMENDED.      2. Surgical [P], gastric :      -  ANTRAL AND OXYNTIC MUCOSA WITH MILD CHEMICAL/REACTIVE CHANGE.      -  NO HELICOBACTER PYLORI ORGANISMS IDENTIFIED ON H&E STAINED SLIDE.      3. Surgical [P], gastric polyps, polyp (12) :      -  MIXED FUNDIC GLAND AND HYPERPLASTIC POLYPS (PREDOMINANTLY FUNDIC GLAND) WITH      EVIDENCE OF SUBMUCOSAL INVOLVEMENT (I.E.GASTRITIS CYSTICA POLYPOSA).      -  NEGATIVE FOR DYSPLASIA.      4. Surgical [P], esophagus :      -  SQUAMOCOLUMNAR JUNCTIONAL MUCOSA WITH FOCAL INTESTINAL METAPLASIA CONSISTENT      WITH BARRETT'S ESOPHAGUS IN THE APPROPRIATE ENDOSCOPIC SETTING.      -  NEGATIVE FOR DYSPLASIA.   06/19/23 EGD Impression:  - Benign- appearing esophageal stenosis. Dilated. Biopsied.  - Two gastric polyps. Resected and retrieved. - Normal examined duodenum. Path: FINAL DIAGNOSIS        1. Surgical [P], GE junction :       GASTROESOPHAGEAL MUCOSA WITH INTESTINAL METAPLASIA CONSISTENT WITH BARRETT'S       ESOPHAGUS.       NEGATIVE FOR DYSPLASIA.        2. Surgical [P], gastric polyp, polyp (2) :       FUNDIC GLAND POLYPS (2).       NEGATIVE FOR DYSPLASIA AND MALIGNANCY.    06/21/23 ESOPHAGUS/BARIUM SWALLOW/TABLET STUDY  IMPRESSION: Esophagram performed with evidence of esophageal dysmotility and subtle smooth walled narrowing at the GE junction.  Assessment: Encounter Diagnoses  Name Primary?   Dysphagia, unspecified type Yes   Gastroesophageal reflux disease, unspecified whether esophagitis present    Barrett's esophagus without dysplasia    Loss of weight    Esophageal motility disorder        Patient had improvement in his dysphagia after esophageal dilation in 12/2022, but unfortunately, he has had a recurrence in his dysphagia. He is currently restricting his diet due to his issues with  dysphagia. He also still has some uncontrolled reflux but cannot tolerate taking higher dose of his PPI therapy. He states he had SE's and stopped the Voquenza. He asks for other options. He will continue the pantoprazole for  now. Patient has not had complete resolution of dysphagia with recent dilatation. His Barium esophagram barium swallow study showed some subtle narrowing at the junction between the esophagus and the stomach as well as some evidence of a potential motility disorder. Pt started on baclofen 5mg  which he is only taking once daily due to constipation and urinary frequency he states it causes. We made informed decision today to discontinue it. He would like to proceed with another intervention. Discussed with Dr. Leonides Schanz and we can go ahead and proceed with Botox injection of the GE junction at the hospital. He will proceed with esophageal manometry as scheduled in August as well. I recommended he discuss with his PCP about medications for anxiety and depression as it is greatly impacting his overall quality of life.  Plan: - Continue Pantoprazole 40 mg po daily -Discontinue baclofen d/t side effects -Pending esophageal manometry, currently no cancellations or sooner appointments -EGD with botox at GE junction at hospital with Dr. Leonides Schanz -Next colonoscopy due in 2026 for colon cancer screening    Thank you for the courtesy of this consult. Please call me with any questions or concerns.   Porscha Axley, FNP-C [facility] 06/28/2023, 3:07 PM  Cc: Emilio Aspen, *

## 2023-06-28 NOTE — Patient Instructions (Addendum)
 Continue Pantoprazole  Discontinue Baclofen  You have been scheduled for an endoscopy with botox. Please follow written instructions given to you at your visit today.  If you use inhalers (even only as needed), please bring them with you on the day of your procedure.  If you take any of the following medications, they will need to be adjusted prior to your procedure:   DO NOT TAKE 7 DAYS PRIOR TO TEST- Trulicity (dulaglutide) Ozempic, Wegovy (semaglutide) Mounjaro (tirzepatide) Bydureon Bcise (exanatide extended release)  DO NOT TAKE 1 DAY PRIOR TO YOUR TEST Rybelsus (semaglutide) Adlyxin (lixisenatide) Victoza (liraglutide) Byetta (exanatide) __________________________________________________________________________ _______________________________________________________  If your blood pressure at your visit was 140/90 or greater, please contact your primary care physician to follow up on this.  _______________________________________________________  If you are age 72 or older, your body mass index should be between 23-30. Your Body mass index is 28.28 kg/m. If this is out of the aforementioned range listed, please consider follow up with your Primary Care Provider.  If you are age 18 or younger, your body mass index should be between 19-25. Your Body mass index is 28.28 kg/m. If this is out of the aformentioned range listed, please consider follow up with your Primary Care Provider.   ________________________________________________________  The [facility] providers would like to encourage you to use [facility] to communicate with providers for non-urgent requests or questions.  Due to long hold times on the telephone, sending your provider a message by [facility] may be a faster and more efficient way to get a response.  Please allow 48 business hours for a response.  Please remember that this is for non-urgent requests.   _______________________________________________________  Thank you for trusting me with your gastrointestinal care!   Reed Pandy, PA

## 2023-06-29 ENCOUNTER — Telehealth: Payer: Self-pay | Admitting: Gastroenterology

## 2023-06-29 NOTE — Telephone Encounter (Signed)
 Patient called in to ask further questions regarding EGD on Monday. He'd like to know how soon he should notice any improvement in symptoms once receiving the botox & would also like to know if he will be required to take off his heart monitor for procedure? He's been wearing it for 1 week d/t change in BP meds, and is suppose to wear it for 2 weeks but if necessary will take it off.

## 2023-06-29 NOTE — Telephone Encounter (Signed)
 Pt advised that we will need to reschedule procedure d/t needing cardiac clearance. He will contact Dr. Donnamarie Poag office as well. Fax has been sent requesting clearance from Dr. Orson Aloe.

## 2023-06-29 NOTE — Telephone Encounter (Signed)
 Inbound call from patient requesting a call back to discuss his procedure that he has with Dr. Leonides Schanz at [facility] on 4/14. Please advise.

## 2023-07-02 ENCOUNTER — Encounter (HOSPITAL_COMMUNITY): Admission: RE | Payer: Self-pay | Source: Home / Self Care

## 2023-07-02 ENCOUNTER — Ambulatory Visit (HOSPITAL_COMMUNITY): Admission: RE | Admit: 2023-07-02 | Source: Home / Self Care | Admitting: Internal Medicine

## 2023-07-02 SURGERY — EGD (ESOPHAGOGASTRODUODENOSCOPY)
Anesthesia: Monitor Anesthesia Care

## 2023-07-02 NOTE — Telephone Encounter (Signed)
 Patient called again requesting a call back if possible.

## 2023-07-02 NOTE — Progress Notes (Signed)
 I agree with the assessment and plan as outlined by Ms. May.

## 2023-07-02 NOTE — Telephone Encounter (Signed)
Patient called to reschedule procedure  

## 2023-07-03 DIAGNOSIS — R1319 Other dysphagia: Secondary | ICD-10-CM | POA: Diagnosis not present

## 2023-07-03 DIAGNOSIS — K5900 Constipation, unspecified: Secondary | ICD-10-CM | POA: Diagnosis not present

## 2023-07-03 DIAGNOSIS — R634 Abnormal weight loss: Secondary | ICD-10-CM | POA: Diagnosis not present

## 2023-07-03 DIAGNOSIS — F419 Anxiety disorder, unspecified: Secondary | ICD-10-CM | POA: Diagnosis not present

## 2023-07-03 DIAGNOSIS — I10 Essential (primary) hypertension: Secondary | ICD-10-CM | POA: Diagnosis not present

## 2023-07-03 DIAGNOSIS — K219 Gastro-esophageal reflux disease without esophagitis: Secondary | ICD-10-CM | POA: Diagnosis not present

## 2023-07-03 DIAGNOSIS — R002 Palpitations: Secondary | ICD-10-CM | POA: Diagnosis not present

## 2023-07-03 DIAGNOSIS — J328 Other chronic sinusitis: Secondary | ICD-10-CM | POA: Diagnosis not present

## 2023-07-03 NOTE — Telephone Encounter (Signed)
 Spoke with patient & clarified that it was recommended for him to take pantoprazole once daily. He would like to be rescheduled for procedure, however per patient he saw PCP today & he will not be given clearance until at least May 1st. Heart monitor was sent off & PCP is awaiting results. Pt advised we will continue to follow up on clearance & will work on getting him rescheduled.

## 2023-07-03 NOTE — Telephone Encounter (Signed)
 Patient stated he picked up pantoprazole medication today and instructions stated take medication twice a day. Stated he believed it was only once a day. Patient requesting for clarification. Please advise, thank you.

## 2023-07-10 ENCOUNTER — Encounter: Payer: Self-pay | Admitting: Internal Medicine

## 2023-07-11 ENCOUNTER — Telehealth: Payer: Self-pay | Admitting: Internal Medicine

## 2023-07-11 DIAGNOSIS — R002 Palpitations: Secondary | ICD-10-CM | POA: Diagnosis not present

## 2023-07-11 NOTE — Telephone Encounter (Signed)
 Patient called and stated that he would like to speak to [facility] regarding him eating beans and feeling like it is stuck in his esophagus. Patient is requesting a call back. Please advise.

## 2023-07-12 ENCOUNTER — Encounter: Payer: Self-pay | Admitting: Internal Medicine

## 2023-07-12 NOTE — Telephone Encounter (Signed)
 Returned patient call & pt is currently able to hold down food/fluids okay. He's only able to eat small meals daily (2 bowels of soup & protein shakes). Pt is very anxious feels "that he is going to die from this" & states he is becoming depressed d/t his current health situation. He meets with PCP next week & should be receiving clearance on 5/1. Hospital date 5/22 & 6/16 is currently full, next available date is 6/23. Will discuss with Dr. Rosaline Coma regarding rescheduling.

## 2023-07-13 ENCOUNTER — Other Ambulatory Visit: Payer: Self-pay

## 2023-07-13 ENCOUNTER — Telehealth: Payer: Self-pay | Admitting: Internal Medicine

## 2023-07-13 DIAGNOSIS — R1310 Dysphagia, unspecified: Secondary | ICD-10-CM

## 2023-07-13 DIAGNOSIS — R3989 Other symptoms and signs involving the genitourinary system: Secondary | ICD-10-CM | POA: Diagnosis not present

## 2023-07-13 DIAGNOSIS — K224 Dyskinesia of esophagus: Secondary | ICD-10-CM

## 2023-07-13 NOTE — Telephone Encounter (Signed)
 Rescheduled EGD w/botox for 08/09/23 at 11:30 am at [facility] with Dr. Rosaline Coma. Hospital orders placed & new instructions given to patient. He is aware.

## 2023-07-13 NOTE — Telephone Encounter (Signed)
 Contact [facility] & waiting on a call back from CN for rescheduling.

## 2023-07-13 NOTE — Telephone Encounter (Signed)
 Inbound call from patient requesting to speak with the nurse regarding his pantoprazole . Patient has also sent a Mychart message. Please advise.

## 2023-07-13 NOTE — Telephone Encounter (Signed)
 See phone note 07/13/23.

## 2023-07-13 NOTE — Telephone Encounter (Signed)
 Pt made aware of NP recommendations. He has not taken his losartan or his pantoprazole  today & says since we last spoke his congestion has cleared up. He believes it is one of those two medications causing the congestion, so he's going to resume the losartan this afternoon. He will call back in 1-2 weeks to provide symptom update or will message/call in sooner if needed.

## 2023-07-13 NOTE — Telephone Encounter (Signed)
 Patient calling back in regards to previous note.  advised him a nurse will reach back with him shortly. Patient advised understanding.

## 2023-07-13 NOTE — Telephone Encounter (Signed)
 Returned call to patient & he has concerns that the pantoprazole  is causing him to have congestion and increase urination for the past week. He was seen at the walk in clinic at [facility] today & they told him his nasal passages were swollen. Also, did a UA (normal per pt) & blood work (pending). He's taking Allegra & Nasacort. He was prescribed pantoprazole  back in February & these symptoms were not present. Advised patient these are not common side effects of the medication & since he has been on it for some time now it is unlikely it is from the pantoprazole . He takes HCTZ daily & advised him this can cause increase in urination. Advised him of the importance of the PPI given his symptoms. Pt is very anxious and insist that the side effects are from medication & would like to start dexilant and pause pantoprazole  for a few days to see if symptoms resolve. States it was discussed as an option at time of OV & would like me to discuss further with Deanna. Last seen at OV 06/28/23.

## 2023-07-16 ENCOUNTER — Other Ambulatory Visit: Payer: Self-pay | Admitting: Gastroenterology

## 2023-07-16 ENCOUNTER — Encounter: Payer: Self-pay | Admitting: Internal Medicine

## 2023-07-16 DIAGNOSIS — K2270 Barrett's esophagus without dysplasia: Secondary | ICD-10-CM

## 2023-07-16 DIAGNOSIS — K219 Gastro-esophageal reflux disease without esophagitis: Secondary | ICD-10-CM

## 2023-07-16 MED ORDER — DEXLANSOPRAZOLE 60 MG PO CPDR: 60.0000 mg | DELAYED_RELEASE_CAPSULE | Freq: Every day | ORAL | 3 refills | Status: DC

## 2023-07-16 NOTE — Progress Notes (Signed)
 Patient requests dexilant, sent RX

## 2023-07-17 DIAGNOSIS — R002 Palpitations: Secondary | ICD-10-CM | POA: Diagnosis not present

## 2023-07-18 ENCOUNTER — Encounter: Payer: Self-pay | Admitting: Internal Medicine

## 2023-07-19 ENCOUNTER — Encounter: Payer: Self-pay | Admitting: Internal Medicine

## 2023-07-19 DIAGNOSIS — R634 Abnormal weight loss: Secondary | ICD-10-CM | POA: Diagnosis not present

## 2023-07-19 DIAGNOSIS — F419 Anxiety disorder, unspecified: Secondary | ICD-10-CM | POA: Diagnosis not present

## 2023-07-19 DIAGNOSIS — J328 Other chronic sinusitis: Secondary | ICD-10-CM | POA: Diagnosis not present

## 2023-07-19 DIAGNOSIS — I10 Essential (primary) hypertension: Secondary | ICD-10-CM | POA: Diagnosis not present

## 2023-07-19 DIAGNOSIS — R1319 Other dysphagia: Secondary | ICD-10-CM | POA: Diagnosis not present

## 2023-07-19 DIAGNOSIS — K5900 Constipation, unspecified: Secondary | ICD-10-CM | POA: Diagnosis not present

## 2023-07-19 DIAGNOSIS — K219 Gastro-esophageal reflux disease without esophagitis: Secondary | ICD-10-CM | POA: Diagnosis not present

## 2023-07-19 DIAGNOSIS — R002 Palpitations: Secondary | ICD-10-CM | POA: Diagnosis not present

## 2023-07-19 NOTE — Telephone Encounter (Addendum)
 Received fax from Dr. Teofilo Fellers stating "patient cleared for procedure from cardiac perspective. No significant abnormality seen on cardiac monitor." Will have file uploaded into media tab. Pt is already aware.

## 2023-07-20 ENCOUNTER — Ambulatory Visit: Admitting: Gastroenterology

## 2023-07-24 ENCOUNTER — Other Ambulatory Visit: Payer: Self-pay

## 2023-07-24 ENCOUNTER — Encounter: Payer: Self-pay | Admitting: Internal Medicine

## 2023-07-24 ENCOUNTER — Emergency Department (HOSPITAL_COMMUNITY)
Admission: EM | Admit: 2023-07-24 | Discharge: 2023-07-24 | Disposition: A | Discharge status: Home / Self Care | Attending: Emergency Medicine | Admitting: Emergency Medicine

## 2023-07-24 ENCOUNTER — Emergency Department (HOSPITAL_COMMUNITY)

## 2023-07-24 ENCOUNTER — Encounter (HOSPITAL_COMMUNITY): Payer: Self-pay | Admitting: Emergency Medicine

## 2023-07-24 DIAGNOSIS — K5730 Diverticulosis of large intestine without perforation or abscess without bleeding: Secondary | ICD-10-CM | POA: Diagnosis not present

## 2023-07-24 DIAGNOSIS — Z79899 Other long term (current) drug therapy: Secondary | ICD-10-CM | POA: Insufficient documentation

## 2023-07-24 DIAGNOSIS — N281 Cyst of kidney, acquired: Secondary | ICD-10-CM | POA: Diagnosis not present

## 2023-07-24 DIAGNOSIS — R1032 Left lower quadrant pain: Secondary | ICD-10-CM | POA: Diagnosis not present

## 2023-07-24 DIAGNOSIS — I129 Hypertensive chronic kidney disease with stage 1 through stage 4 chronic kidney disease, or unspecified chronic kidney disease: Secondary | ICD-10-CM | POA: Insufficient documentation

## 2023-07-24 DIAGNOSIS — R109 Unspecified abdominal pain: Secondary | ICD-10-CM

## 2023-07-24 DIAGNOSIS — R1030 Lower abdominal pain, unspecified: Secondary | ICD-10-CM | POA: Diagnosis present

## 2023-07-24 DIAGNOSIS — N189 Chronic kidney disease, unspecified: Secondary | ICD-10-CM | POA: Diagnosis not present

## 2023-07-24 LAB — CBC WITH DIFFERENTIAL/PLATELET
Abs Immature Granulocytes: 0.03 10*3/uL (ref 0.00–0.07)
Basophils Absolute: 0.1 10*3/uL (ref 0.0–0.1)
Basophils Relative: 1 %
Eosinophils Absolute: 0.2 10*3/uL (ref 0.0–0.5)
Eosinophils Relative: 3 %
HCT: 43.3 % (ref 39.0–52.0)
Hemoglobin: 14.5 g/dL (ref 13.0–17.0)
Immature Granulocytes: 0 %
Lymphocytes Relative: 22 %
Lymphs Abs: 1.9 10*3/uL (ref 0.7–4.0)
MCH: 28.7 pg (ref 26.0–34.0)
MCHC: 33.5 g/dL (ref 30.0–36.0)
MCV: 85.7 fL (ref 80.0–100.0)
Monocytes Absolute: 1 10*3/uL (ref 0.1–1.0)
Monocytes Relative: 11 %
Neutro Abs: 5.5 10*3/uL (ref 1.7–7.7)
Neutrophils Relative %: 63 %
Platelets: 284 10*3/uL (ref 150–400)
RBC: 5.05 MIL/uL (ref 4.22–5.81)
RDW: 13.2 % (ref 11.5–15.5)
WBC: 8.6 10*3/uL (ref 4.0–10.5)
nRBC: 0 % (ref 0.0–0.2)

## 2023-07-24 LAB — COMPREHENSIVE METABOLIC PANEL WITH GFR
ALT: 32 U/L (ref 0–44)
AST: 19 U/L (ref 15–41)
Albumin: 4.3 g/dL (ref 3.5–5.0)
Alkaline Phosphatase: 79 U/L (ref 38–126)
Anion gap: 9 (ref 5–15)
BUN: 14 mg/dL (ref 8–23)
CO2: 26 mmol/L (ref 22–32)
Calcium: 9.3 mg/dL (ref 8.9–10.3)
Chloride: 99 mmol/L (ref 98–111)
Creatinine, Ser: 0.71 mg/dL (ref 0.61–1.24)
GFR, Estimated: >60 mL/min (ref >60)
Glucose, Bld: 109 mg/dL — ABNORMAL HIGH (ref 70–99)
Potassium: 3.5 mmol/L (ref 3.5–5.1)
Sodium: 134 mmol/L — ABNORMAL LOW (ref 135–145)
Total Bilirubin: 0.8 mg/dL (ref 0.0–1.2)
Total Protein: 7.5 g/dL (ref 6.5–8.1)

## 2023-07-24 LAB — LIPASE, BLOOD: Lipase: 27 U/L (ref 11–51)

## 2023-07-24 MED ORDER — ONDANSETRON HCL 4 MG/2ML IJ SOLN
4.0000 mg | Freq: Once | INTRAMUSCULAR | Status: DC
  Filled 2023-07-24: qty 2

## 2023-07-24 MED ORDER — IOHEXOL 300 MG/ML  SOLN
100.0000 mL | Freq: Once | INTRAMUSCULAR | Status: AC | PRN
  Administered 2023-07-24: 100 mL via INTRAVENOUS

## 2023-07-24 MED ORDER — SODIUM CHLORIDE 0.9 % IV BOLUS
1000.0000 mL | Freq: Once | INTRAVENOUS | Status: AC
  Administered 2023-07-24: 1000 mL via INTRAVENOUS

## 2023-07-24 NOTE — ED Triage Notes (Signed)
 Pt reports he believes there is food stuck in his esophagus. Pt reports he has had this problem before. Pt has procedure to dilate his esophagus. Also reports cough and congestion.

## 2023-07-24 NOTE — Discharge Instructions (Signed)
 We evaluated you for your abdominal pain.  Your testing in the emergency department including CT scan and laboratory testing was reassuring.  We did not see any sign of a food impaction (food stuck in your esophagus).  Please follow-up closely with your GI doctor.  Please continue to take your medications as prescribed.  If you develop any new or worsening symptoms such as recurrent symptoms, severe pain, fevers or chills, vomiting, or any other symptoms, please return to the emergency department for reassessment.

## 2023-07-24 NOTE — Telephone Encounter (Signed)
 Patient called into the office again. I spoke with patient. He states that he wanted to know if he could be scheduled for EGD only in the [facility]. I recommended that patient keep EGD with Botox as scheduled at [facility] on 08/09/23 and if he wishes to not have Botox he can inform Dr. Rosaline Coma at that time. Patient thanked me for the information, he plans to keep procedure as scheduled.

## 2023-07-24 NOTE — Telephone Encounter (Signed)
 Patient requesting a call regarding previous mychart message. Please advise, thank you.

## 2023-07-24 NOTE — ED Provider Notes (Signed)
 [place] EMERGENCY DEPARTMENT AT [facility] Provider Note  CSN: [id number] Arrival date & time: 07/24/23 1756  Chief Complaint(s) Food Bolus  HPI Frederick Weaver. is a 73 y.o. male history of hypertension, hyperlipidemia, esophageal dysmotility, esophageal stricture presenting with concern for abdominal pain.  Patient reports that he has abdominal pain.  He localizes this pain to the lower abdomen.  He reports that he was concerned that his symptoms were due to esophageal food impaction however he reports that he has been swallowing liquids without difficulty, denies any chest pain or foreign body sensation in the chest or throat.  He reports some nausea as well.  No vomiting.  No diarrhea.  Reports still having normal bowel movements.  No urinary symptoms.  No chest pain or shortness of breath.   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   Hyperlipidemia    Hypertension    Seasonal allergies    There are no active problems to display for this patient.  Home Medication(s) Prior to Admission medications   Medication Sig Start Date End Date Taking? Authorizing Provider  aluminum-magnesium  hydroxide 200-200 MG/5ML suspension Take 20 mLs by mouth every 6 (six) hours as needed for indigestion.    [provider]  Baclofen  5 MG TABS Take 1 tablet (5 mg total) by mouth 2 (two) times daily before a meal. 06/22/23   Daina Drum, MD  cholecalciferol (VITAMIN D3) 25 MCG (1000 UNIT) tablet 1 tablet Orally Once a day    [provider]  dexlansoprazole  (DEXILANT ) 60 MG capsule Take 1 capsule (60 mg total) by mouth daily. 07/16/23   May, Deanna J, NP  fexofenadine (ALLEGRA) 180 MG tablet Take 180 mg by mouth daily.    [provider]  losartan-hydrochlorothiazide (HYZAAR) 100-12.5 MG tablet Take 1  tablet by mouth daily. 04/28/21   [provider]  metoprolol succinate (TOPROL-XL) 50 MG 24 hr tablet Take 25 mg by mouth daily. 1/2 tablet daily 06/16/23   [provider]  MULTIPLE VITAMIN PO Take 1 tablet by mouth daily.    [provider]  [facility] injection  11/10/22   [provider]                                                                                                                                    Past Surgical History Past Surgical History:  Procedure Laterality Date   COLONOSCOPY WITH PROPOFOL  N/A 11/09/2014   Procedure: COLONOSCOPY WITH PROPOFOL ;  Surgeon: Garrett Kallman, MD;  Location: [facility];  Service: Endoscopy;  Laterality: N/A;   HERNIA REPAIR  03/20/1970   inguinal   LYMPH NODE BIOPSY  03/21/1971   benign  Family History Family History  Problem Relation Age of Onset   Breast cancer Mother    Colon polyps Mother    Heart disease Mother    Congestive Heart Failure Mother    Lung cancer Father        smoker but had quit years before   Colon cancer Neg Hx    Esophageal cancer Neg Hx    Rectal cancer Neg Hx    Stomach cancer Neg Hx     Social History Social History   Tobacco Use   Smoking status: Never   Smokeless tobacco: Never  Vaping Use   Vaping status: Never Used  Substance Use Topics   Alcohol use: No   Drug use: No   Allergies Ampicillin, Codeine, Demerol [meperidine], Doxycycline, and Penicillins  Review of Systems Review of Systems  All other systems reviewed and are negative.   Physical Exam Vital Signs  I have reviewed the triage vital signs BP (!) 160/96 (BP Location: Left Arm)   Pulse 90   Temp 98.1 F (36.7 C) (Oral)   Resp 18   SpO2 98%  Physical Exam Vitals and nursing note reviewed.  Constitutional:      General: He is not in acute distress.    Appearance: Normal appearance.  HENT:     Mouth/Throat:     Mouth: Mucous membranes are moist.     Comments: Tolerating  secretions and swallowing without difficulty Eyes:     Conjunctiva/sclera: Conjunctivae normal.  Cardiovascular:     Rate and Rhythm: Normal rate and regular rhythm.  Pulmonary:     Effort: Pulmonary effort is normal. No respiratory distress.     Breath sounds: Normal breath sounds.  Abdominal:     General: Abdomen is flat.     Palpations: Abdomen is soft.     Tenderness: There is abdominal tenderness (LLQ).  Musculoskeletal:     Right lower leg: No edema.     Left lower leg: No edema.  Skin:    General: Skin is warm and dry.     Capillary Refill: Capillary refill takes less than 2 seconds.  Neurological:     Mental Status: He is alert and oriented to person, place, and time. Mental status is at baseline.  Psychiatric:        Mood and Affect: Mood normal.        Behavior: Behavior normal.     ED Results and Treatments Labs (all labs ordered are listed, but only abnormal results are displayed) Labs Reviewed  COMPREHENSIVE METABOLIC PANEL WITH GFR - Abnormal; Notable for the following components:      Result Value   Sodium 134 (*)    Glucose, Bld 109 (*)    All other components within normal limits  CBC WITH DIFFERENTIAL/PLATELET  LIPASE, BLOOD  Radiology CT ABDOMEN PELVIS W CONTRAST Result Date: 07/24/2023 CLINICAL DATA:  Acute abdominal pain, possible food bolus within the esophagus. EXAM: CT ABDOMEN AND PELVIS WITH CONTRAST TECHNIQUE: Multidetector CT imaging of the abdomen and pelvis was performed using the standard protocol following bolus administration of intravenous contrast. RADIATION DOSE REDUCTION: This exam was performed according to the departmental dose-optimization program which includes automated exposure control, adjustment of the mA and/or kV according to patient size and/or use of iterative reconstruction technique. CONTRAST:  100mL OMNIPAQUE  IOHEXOL 300 MG/ML  SOLN COMPARISON:  Lung bases are FINDINGS: Lower chest: Lung bases are free of acute infiltrate or effusion. Hepatobiliary: No focal liver abnormality is seen. No gallstones, gallbladder wall thickening, or biliary dilatation. Pancreas: Unremarkable. No pancreatic ductal dilatation or surrounding inflammatory changes. Spleen: Normal in size without focal abnormality. Adrenals/Urinary Tract: Adrenal glands are within normal limits. Kidneys are well visualized bilaterally. Normal excretion is noted on delayed images. Small cyst is noted in the left kidney. No follow-up is recommended. The bladder is decompressed. Stomach/Bowel: Scattered diverticular change of the colon is noted without evidence of diverticulitis. Fecal material is noted throughout the colon. Some inspissated barium is noted within the appendix although the appendix is otherwise within normal limits. Small bowel and stomach are unremarkable. The visualized esophagus is within normal limits. No definitive retained food bolus is seen. Vascular/Lymphatic: Aortic atherosclerosis. No enlarged abdominal or pelvic lymph nodes. Reproductive: Prostate is unremarkable. Other: No abdominal wall hernia or abnormality. No abdominopelvic ascites. Musculoskeletal: Degenerative changes of lumbar spine are noted. IMPRESSION: No evidence of retained food bolus within the distal esophagus. Diverticulosis without diverticulitis. Electronically Signed   By: Violeta Grey M.D.   On: 07/24/2023 20:27    Pertinent labs & imaging results that were available during my care of the patient were reviewed by me and considered in my medical decision making (see MDM for details).  Medications Ordered in ED Medications  ondansetron (ZOFRAN) injection 4 mg (4 mg Intravenous Not Given 07/24/23 1907)  sodium chloride  0.9 % bolus 1,000 mL (1,000 mLs Intravenous New Bag/Given 07/24/23 1902)  iohexol (OMNIPAQUE) 300 MG/ML solution 100 mL (100 mLs Intravenous Contrast  Given 07/24/23 2004)                                                                                                                                     Procedures Procedures  (including critical care time)  Medical Decision Making / ED Course   MDM:  73 year old presenting to the emergency department with abdominal pain.  Patient initial complaint was concern for food bolus impaction however the patient clinically has no symptoms of this, is tolerating p.o., tolerating secretions, denies chest pain.  He localizes his pain to the abdomen.  Given age and abdominal pain, will check labs, CT scan.  Differential includes diverticulitis, obstruction, perforation, volvulus, pancreatitis, cholecystitis, abscess.  He denies any urinary symptoms.  Will reassess.  He does report some nausea so we will give Zofran.  Clinical Course as of 07/24/23 2125  Tue Jul 24, 2023  2123 Workup negative.  CT scan obtained, no acute intra-abdominal process, no sign of food in the distal esophagus.  He actually reports his symptoms have totally resolved at this point.  Unclear cause of his symptoms.  Overall concern for impacted food was low.  Given that his symptoms have resolved, feel patient is stable for discharge.  Advise close follow-up with his outpatient GI provider. Will discharge patient to home. All questions answered. Patient comfortable with plan of discharge. Return precautions discussed with patient and specified on the after visit summary.  [WS]    Clinical Course User Index [WS] Mordecai Applebaum, MD     Additional history obtained:  -External records from outside source obtained and reviewed including: Chart review including previous notes, labs, imaging, consultation notes including prior GI notes     Lab Tests: -I ordered, reviewed, and interpreted labs.   The pertinent results include:   Labs Reviewed  COMPREHENSIVE METABOLIC PANEL WITH GFR - Abnormal; Notable for the following  components:      Result Value   Sodium 134 (*)    Glucose, Bld 109 (*)    All other components within normal limits  CBC WITH DIFFERENTIAL/PLATELET  LIPASE, BLOOD    Notable for borderline hyponatremia   Imaging Studies ordered: I ordered imaging studies including CT abdomen pelvis  On my interpretation imaging demonstrates no acute process I independently visualized and interpreted imaging. I agree with the radiologist interpretation   Medicines ordered and prescription drug management: Meds ordered this encounter  Medications   ondansetron (ZOFRAN) injection 4 mg   sodium chloride  0.9 % bolus 1,000 mL   iohexol (OMNIPAQUE) 300 MG/ML solution 100 mL    -I have reviewed the patients home medicines and have made adjustments as needed  Social Determinants of Health:  Diagnosis or treatment significantly limited by social determinants of health: obesity   Reevaluation: After the interventions noted above, I reevaluated the patient and found that their symptoms have resolved  Co morbidities that complicate the patient evaluation  Past Medical History:  Diagnosis Date   Cancer (HCC)    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   Hyperlipidemia    Hypertension    Seasonal allergies       Dispostion: Disposition decision including need for hospitalization was considered, and patient discharged from emergency department.    Final Clinical Impression(s) / ED Diagnoses Final diagnoses:  Abdominal pain, unspecified abdominal location     This chart was dictated using voice recognition software.  Despite best efforts to proofread,  errors can occur which can change the documentation meaning.    Mordecai Applebaum, MD 07/24/23 2125

## 2023-07-26 ENCOUNTER — Ambulatory Visit: Admitting: Gastroenterology

## 2023-07-29 ENCOUNTER — Observation Stay (HOSPITAL_COMMUNITY)
Admission: EM | Admit: 2023-07-29 | Discharge: 2023-07-30 | Disposition: A | Discharge status: Home / Self Care | Attending: Internal Medicine | Admitting: Internal Medicine

## 2023-07-29 ENCOUNTER — Other Ambulatory Visit: Payer: Self-pay

## 2023-07-29 ENCOUNTER — Encounter (HOSPITAL_COMMUNITY): Payer: Self-pay | Admitting: *Deleted

## 2023-07-29 ENCOUNTER — Telehealth: Payer: Self-pay | Admitting: Gastroenterology

## 2023-07-29 DIAGNOSIS — Z79899 Other long term (current) drug therapy: Secondary | ICD-10-CM | POA: Insufficient documentation

## 2023-07-29 DIAGNOSIS — K222 Esophageal obstruction: Secondary | ICD-10-CM | POA: Diagnosis not present

## 2023-07-29 DIAGNOSIS — K219 Gastro-esophageal reflux disease without esophagitis: Secondary | ICD-10-CM | POA: Insufficient documentation

## 2023-07-29 DIAGNOSIS — E785 Hyperlipidemia, unspecified: Secondary | ICD-10-CM | POA: Diagnosis not present

## 2023-07-29 DIAGNOSIS — R140 Abdominal distension (gaseous): Secondary | ICD-10-CM | POA: Insufficient documentation

## 2023-07-29 DIAGNOSIS — R1310 Dysphagia, unspecified: Secondary | ICD-10-CM | POA: Diagnosis not present

## 2023-07-29 DIAGNOSIS — Z85828 Personal history of other malignant neoplasm of skin: Secondary | ICD-10-CM | POA: Insufficient documentation

## 2023-07-29 DIAGNOSIS — I129 Hypertensive chronic kidney disease with stage 1 through stage 4 chronic kidney disease, or unspecified chronic kidney disease: Secondary | ICD-10-CM | POA: Diagnosis not present

## 2023-07-29 DIAGNOSIS — K224 Dyskinesia of esophagus: Secondary | ICD-10-CM | POA: Insufficient documentation

## 2023-07-29 DIAGNOSIS — N189 Chronic kidney disease, unspecified: Secondary | ICD-10-CM | POA: Insufficient documentation

## 2023-07-29 DIAGNOSIS — R933 Abnormal findings on diagnostic imaging of other parts of digestive tract: Secondary | ICD-10-CM | POA: Diagnosis not present

## 2023-07-29 DIAGNOSIS — I10 Essential (primary) hypertension: Secondary | ICD-10-CM | POA: Diagnosis not present

## 2023-07-29 LAB — CBC
HCT: 41.1 % (ref 39.0–52.0)
Hemoglobin: 14 g/dL (ref 13.0–17.0)
MCH: 28.7 pg (ref 26.0–34.0)
MCHC: 34.1 g/dL (ref 30.0–36.0)
MCV: 84.2 fL (ref 80.0–100.0)
Platelets: 324 10*3/uL (ref 150–400)
RBC: 4.88 MIL/uL (ref 4.22–5.81)
RDW: 12.9 % (ref 11.5–15.5)
WBC: 8 10*3/uL (ref 4.0–10.5)
nRBC: 0 % (ref 0.0–0.2)

## 2023-07-29 LAB — COMPREHENSIVE METABOLIC PANEL WITH GFR
ALT: 26 U/L (ref 0–44)
AST: 18 U/L (ref 15–41)
Albumin: 3.8 g/dL (ref 3.5–5.0)
Alkaline Phosphatase: 73 U/L (ref 38–126)
Anion gap: 13 (ref 5–15)
BUN: 8 mg/dL (ref 8–23)
CO2: 21 mmol/L — ABNORMAL LOW (ref 22–32)
Calcium: 9.5 mg/dL (ref 8.9–10.3)
Chloride: 101 mmol/L (ref 98–111)
Creatinine, Ser: 0.8 mg/dL (ref 0.61–1.24)
GFR, Estimated: >60 mL/min (ref >60)
Glucose, Bld: 99 mg/dL (ref 70–99)
Potassium: 3.6 mmol/L (ref 3.5–5.1)
Sodium: 135 mmol/L (ref 135–145)
Total Bilirubin: 0.9 mg/dL (ref 0.0–1.2)
Total Protein: 6.8 g/dL (ref 6.5–8.1)

## 2023-07-29 MED ORDER — SODIUM CHLORIDE 0.9% FLUSH
3.0000 mL | Freq: Two times a day (BID) | INTRAVENOUS | Status: DC
  Administered 2023-07-29 – 2023-07-30 (×2): 3 mL via INTRAVENOUS

## 2023-07-29 MED ORDER — ACETAMINOPHEN 325 MG PO TABS: 650.0000 mg | ORAL_TABLET | Freq: Four times a day (QID) | ORAL | Status: DC | PRN

## 2023-07-29 MED ORDER — ACETAMINOPHEN 650 MG RE SUPP: 650.0000 mg | Freq: Four times a day (QID) | RECTAL | Status: DC | PRN

## 2023-07-29 MED ORDER — LACTATED RINGERS IV BOLUS
1000.0000 mL | Freq: Once | INTRAVENOUS | Status: AC
  Administered 2023-07-29: 1000 mL via INTRAVENOUS

## 2023-07-29 MED ORDER — POLYETHYLENE GLYCOL 3350 17 G PO PACK: 17.0000 g | PACK | Freq: Every day | ORAL | Status: DC | PRN

## 2023-07-29 MED ORDER — AZITHROMYCIN 250 MG PO TABS: 250.0000 mg | ORAL_TABLET | Freq: Every day | ORAL | Status: DC

## 2023-07-29 NOTE — ED Triage Notes (Signed)
 Patient presents from home states he is scheduled for endo on Tues, was told by his GI doctor to come to ED to be admitted for endo prep tomorrow. States he hasn't been able to eat for several days and is getting dehydrated. States he is scheduled for esophageal dilation and botox injection Tues. Able to drinks some fluids only

## 2023-07-29 NOTE — ED Notes (Signed)
 Assumed care of pt from [place], [place]

## 2023-07-29 NOTE — Telephone Encounter (Signed)
 Received page from this patient earlier this morning regarding feeling that there is air trapped in his esophagus and upper GI tract along with abdominal bloating.  Has had the symptoms for some time, but worse in the last couple of days.  He is actively being evaluated by Dr. Rosaline Coma for these symptoms, to include recent EGD on/1/25 (distal esophageal stricture dilated with 20 mm TTS balloon, benign gastric polyps), esophagram on 06/21/2023 (esophageal dysmotility and subtle smooth walled narrowing at the GE junction).  He was then seen in the ER on 07/24/2023 with similar symptoms.  Labs were unremarkable and CT abdomen/pelvis without any evidence of retained food bolus or other acute intra-abdominal or lower chest pathology.    Chart reviewed, and there are multiple phone calls to the GI clinic over the last few weeks with similar description of symptoms, and patient is currently scheduled for EGD with Dr. Rosaline Coma at [facility] unit on 08/09/2023, along with esophageal manometry on 10/24/2023.  Additionally, he has an appointment scheduled in the GI clinic tomorrow.  Patient placed a second call later in the morning for these same symptoms.  Now stating that he feels like he has been losing weight and poor nutrition due to poor p.o. intake (weight trends reviewed: 06/28/2023: 186#, 06/07/2023: 198 #, 05/02/2023: 201#, 01/17/23: 198#),.  He is tolerating secretions, and has had liquids, but stating that his symptoms keep worsening he "do not think I can wait till tomorrow".  Given patient concerns, I told him he is perfectly fine to come into the emergency department for evaluation and if he requires hospital admission, our service can be consulted inpatient.  By his description, I do not think he has a food impaction, but certainly can be evaluated in the ER for this.  All questions answered and patient reports he would head to the [facility] ER.

## 2023-07-29 NOTE — ED Provider Notes (Signed)
 [place] EMERGENCY DEPARTMENT AT [facility] Provider Note  History  Chief Complaint:  GI Problem  The history is provided by the patient and medical records.  GI Problem    Frederick Weaver. is a 73 y.o. male with a history of CKD, hypertension, hyperlipidemia who presents the emergency department for worsening of dysphagia.  He reports a 81-month illness where he is having difficulty swallowing.  He reports progressive dysphagia with solids now he is only able to swallow small amounts of liquids.  He was unable to eat oatmeal this morning.  He states that he has had esophagram and on my review of the medical record patient had this back in April.  It showed possible esophageal dysmotility.  He Sherrlyn Dolores presented the emergency department last week where CT scan was performed which was not concerning for food impaction.  He reports that he called his GI doctor this morning and they were told him to report to the emergency department given his progressive dysphagia over the past several days.  Wife provides collateral history states that the patient has lost significant mounts of weight.  They report approximately a 20 pound weight loss.  Patient states that he feels like there is gas in his abdomen and every time it comes up it causes him severe pain.  He denies any other abdominal pain.   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   Hyperlipidemia    Hypertension    Seasonal allergies     Past Surgical History:  Procedure Laterality Date   COLONOSCOPY WITH PROPOFOL  N/A 11/09/2014   Procedure: COLONOSCOPY WITH PROPOFOL ;  Surgeon: Garrett Kallman, MD;  Location: [facility];  Service: Endoscopy;  Laterality: N/A;   HERNIA REPAIR  03/20/1970   inguinal   LYMPH NODE BIOPSY  03/21/1971   benign    Family History  Problem Relation Age of  Onset   Breast cancer Mother    Colon polyps Mother    Heart disease Mother    Congestive Heart Failure Mother    Lung cancer Father        smoker but had quit years before   Colon cancer Neg Hx    Esophageal cancer Neg Hx    Rectal cancer Neg Hx    Stomach cancer Neg Hx     Social History   Tobacco Use   Smoking status: Never   Smokeless tobacco: Never  Vaping Use   Vaping status: Never Used  Substance Use Topics   Alcohol use: No   Drug use: No    Review of Systems  Review of Systems   Reviewed and documented in HPI if pertinent.   Physical Exam   ED Triage Vitals  Encounter Vitals Group     BP 07/29/23 1449 (!) 146/92     Systolic BP Percentile --      Diastolic BP Percentile --      Pulse Rate 07/29/23 1449 87     Resp 07/29/23 1449 20     Temp 07/29/23 1449 98.4 F (36.9 C)     Temp src --      SpO2 07/29/23 1449 99 %     Weight 07/29/23 1454 173 lb (78.5 kg)     Height 07/29/23 1454 5\' 8"  (1.727 m)  Head Circumference --      Peak Flow --      Pain Score 07/29/23 1453 0     Pain Loc --      Pain Education --      Exclude from Growth Chart --      Physical Exam Vitals and nursing note reviewed.  Constitutional:      General: He is not in acute distress.    Appearance: He is well-developed.  HENT:     Head: Normocephalic and atraumatic.  Eyes:     Conjunctiva/sclera: Conjunctivae normal.  Cardiovascular:     Rate and Rhythm: Normal rate and regular rhythm.     Heart sounds: No murmur heard. Pulmonary:     Effort: Pulmonary effort is normal. No respiratory distress.     Breath sounds: Normal breath sounds.  Abdominal:     Palpations: Abdomen is soft.     Tenderness: There is no abdominal tenderness.  Musculoskeletal:        General: No swelling.     Cervical back: Neck supple.  Skin:    General: Skin is warm and dry.     Capillary Refill: Capillary refill takes less than 2 seconds.  Neurological:     Mental Status: He is alert.   Psychiatric:        Mood and Affect: Mood normal.      Procedures   Procedures  ED Course - Medical Decision Making  Brief Overview Frederick Weaver. is a 73 y.o. male who presents as per above.  I have reviewed the nursing documentation for past medical history, family history, and social history and agree.  I have reviewed the patient's vital signs. There are no abnormalities.  Initial Differential Diagnoses: I am primarily concerned for dysphagia, electrolyte O'Malley's, dehydration.  Therapies: These medications and interventions were provided for the patient while in the ED.  Medications  azithromycin (ZITHROMAX) tablet 250 mg (has no administration in time range)  sodium chloride  flush (NS) 0.9 % injection 3 mL (has no administration in time range)  acetaminophen (TYLENOL) tablet 650 mg (has no administration in time range)    Or  acetaminophen (TYLENOL) suppository 650 mg (has no administration in time range)  polyethylene glycol (MIRALAX / GLYCOLAX) packet 17 g (has no administration in time range)  lactated ringers  bolus 1,000 mL (1,000 mLs Intravenous New Bag/Given 07/29/23 1613)    Testing Results: On my interpretation labs are significant for : No significant electrolyte abnormalities Hemoglobin 14  See the EMR for full details regarding lab and imaging results.   Medical Decision Making 73 year old male who presents the emergency department above.  Patient's exam is otherwise unremarkable.  He does have mild distention.  No abdominal pain.  Lungs are clear to auscultation bilaterally.  Hemoglobin reassuring.  Electrolytes are reassuring.  Creatinine stable.  I have reviewed the gastroenterology note from this morning.  They have sent the patient to the emergency department for electrolyte monitoring and to rule out food impaction.  At this time patient is tolerating some liquids therefore I feel that impaction is less likely.  I do not think that emergent  endoscopy is necessary.  We discussed with gastroenterology and they feel the patient requires urgent endoscopy.  They have asked to place the patient n.p.o. at midnight for procedure tomorrow.  I have discussed with the hospitalist and they will admit the patient overnight for observation.    Problems Addressed: Dysphagia, unspecified type: acute illness or injury that  poses a threat to life or bodily functions  Amount and/or Complexity of Data Reviewed Labs: ordered.  Risk Decision regarding hospitalization.     ### All radiography studies, electrocardiograms, and laboratory data were personally reviewed by me and incorporated into my medical decision making. Impression   1. Dysphagia, unspecified type      Note: Engineer, civil (consulting) software was used in the creation of this note.     Arminda Landmark, MD 07/29/23 1729    Jerilynn Montenegro, MD 07/30/23 1041

## 2023-07-29 NOTE — H&P (Signed)
 History and Physical   Frederick Weaver. [name] DOB: 06/13/1950 DOA: 07/29/2023  PCP: Benedetta Bradley, MD   Patient coming from: Home  Chief Complaint: Dysphagia  HPI: Frederick Weaver. is a 73 y.o. male with medical history significant of hypertension, hyperlipidemia, GERD, LPR, esophageal dysmotility presenting with progressive dysphagia.  Patient has had a 64-month history of worsening difficulty swallowing.  Prior history of dysphagia going back to last year with initial improvement following dilation but recurrent over the past couple months then progressive. Now only able to swallow small quantities of liquids.  Has had esophagram with some distal narrowing and evidence of dysmotility.  Has further outpatient follow-up planned but symptoms were getting worse and he had severe recurrent sensation of air being trapped in his esophagus and stomach today with about a 15 pound weight loss in the last 6 months and no longer feeling like he can wait for outpatient evaluation.  Denies fevers, chills, shortness of breath, constipation, diarrhea.  ED Course: Vital signs in the ED notable for blood pressure 120s-140 systolic.  Lab workup included CMP with bicarb 0.1.  CBC within normal limits. 1 L given in the ED.  Gastroenterology consulted and agreed with inpatient evaluation and plan for urgent endoscopy and possible intervention tomorrow.  Review of Systems: As per HPI otherwise all other systems reviewed and are negative.  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   Hyperlipidemia    Hypertension    Seasonal allergies     Past Surgical History:  Procedure Laterality Date   COLONOSCOPY WITH PROPOFOL  N/A 11/09/2014   Procedure: COLONOSCOPY WITH PROPOFOL ;  Surgeon: Garrett Kallman, MD;  Location: [facility];  Service: Endoscopy;   Laterality: N/A;   HERNIA REPAIR  03/20/1970   inguinal   LYMPH NODE BIOPSY  03/21/1971   benign    Social History  reports that he has never smoked. He has never used smokeless tobacco. He reports that he does not drink alcohol and does not use drugs.  Allergies  Allergen Reactions   Coreg [Carvedilol] Other (See Comments)    CONGRESTION   Ampicillin Rash   Codeine Nausea Only   Demerol [Meperidine] Nausea Only   Doxycycline Rash   Metoprolol Palpitations   Penicillins Rash    Family History  Problem Relation Age of Onset   Breast cancer Mother    Colon polyps Mother    Heart disease Mother    Congestive Heart Failure Mother    Lung cancer Father        smoker but had quit years before   Colon cancer Neg Hx    Esophageal cancer Neg Hx    Rectal cancer Neg Hx    Stomach cancer Neg Hx   Reviewed on admission  Prior to Admission medications   Medication Sig Start Date End Date Taking? Authorizing Provider  aluminum-magnesium  hydroxide 200-200 MG/5ML suspension Take 20 mLs by mouth every 6 (six) hours as needed for indigestion.   Yes [provider]  azithromycin (ZITHROMAX) 250 MG tablet Take 250 mg by mouth daily. Take 2 tablets by mouth day one.  Take 1 tablet on days 2-5 07/24/23 07/31/23 Yes [provider]  cholecalciferol (VITAMIN D3) 25 MCG (1000 UNIT) tablet 1 tablet Orally Once a day  Yes [provider]  dexlansoprazole  (DEXILANT ) 60 MG capsule Take 1 capsule (60 mg total) by mouth daily. 07/16/23  Yes May, Deanna J, NP  fexofenadine (ALLEGRA) 180 MG tablet Take 180 mg by mouth daily.   Yes [provider]  losartan-hydrochlorothiazide (HYZAAR) 100-12.5 MG tablet Take 1 tablet by mouth daily. 04/28/21  Yes [provider]  pantoprazole  (PROTONIX ) 40 MG tablet Take 40 mg by mouth daily.   Yes [provider]  Baclofen  5 MG TABS Take 1 tablet (5 mg total) by mouth 2 (two) times daily before a meal. 06/22/23   Daina Drum, MD  metoprolol succinate (TOPROL-XL) 50 MG 24 hr tablet Take 25 mg by mouth daily. 1/2 tablet daily 06/16/23   [provider]  MULTIPLE VITAMIN PO Take 1 tablet by mouth daily.    [provider]  [facility] injection  11/10/22   [provider]    Physical Exam: Vitals:   07/29/23 1449 07/29/23 1454 07/29/23 1600  BP: (!) 146/92  128/70  Pulse: 87  77  Resp: 20    Temp: 98.4 F (36.9 C)    SpO2: 99%  100%  Weight:  78.5 kg   Height:  5\' 8"  (1.727 m)     Physical Exam Constitutional:      General: He is not in acute distress.    Appearance: Normal appearance.  HENT:     Head: Normocephalic and atraumatic.     Mouth/Throat:     Mouth: Mucous membranes are moist.     Pharynx: Oropharynx is clear.  Eyes:     Extraocular Movements: Extraocular movements intact.     Pupils: Pupils are equal, round, and reactive to light.  Cardiovascular:     Rate and Rhythm: Normal rate and regular rhythm.     Pulses: Normal pulses.     Heart sounds: Normal heart sounds.  Pulmonary:     Effort: Pulmonary effort is normal. No respiratory distress.     Breath sounds: Normal breath sounds.  Abdominal:     General: Bowel sounds are normal. There is no distension.     Palpations: Abdomen is soft.     Tenderness: There is no abdominal tenderness.  Musculoskeletal:        General: No swelling or deformity.  Skin:    General: Skin is warm and dry.  Neurological:     General: No focal deficit present.     Mental Status: Mental status is at baseline.    Labs on Admission: I have personally reviewed following labs and imaging studies  CBC: Recent Labs  Lab 07/24/23 1855 07/29/23 1505  WBC 8.6 8.0  NEUTROABS 5.5  --   HGB 14.5 14.0  HCT 43.3 41.1  MCV 85.7 84.2  PLT 284 324    Basic Metabolic Panel: Recent Labs  Lab 07/24/23 1855 07/29/23 1505  NA 134* 135  K 3.5 3.6  CL 99 101  CO2 26 21*  GLUCOSE 109* 99  BUN 14 8  CREATININE 0.71 0.80   CALCIUM 9.3 9.5    GFR: Estimated Creatinine Clearance: 80.8 mL/min (by C-G formula based on SCr of 0.8 mg/dL).  Liver Function Tests: Recent Labs  Lab 07/24/23 1855 07/29/23 1505  AST 19 18  ALT 32 26  ALKPHOS 79 73  BILITOT 0.8 0.9  PROT 7.5 6.8  ALBUMIN 4.3 3.8    Urine analysis: No results found for: "COLORURINE", "APPEARANCEUR", "LABSPEC", "PHURINE", "GLUCOSEU", "HGBUR", "BILIRUBINUR", "KETONESUR", "PROTEINUR", "UROBILINOGEN", "NITRITE", "  LEUKOCYTESUR"  Radiological Exams on Admission: No results found.  EKG: Not performed in the emergency department  Assessment/Plan Principal Problem:   Dysphagia Active Problems:   Laryngopharyngeal reflux (LPR)   Esophageal dysmotility   HTN (hypertension)   HLD (hyperlipidemia)   GERD (gastroesophageal reflux disease)   Dysphagia > As per HPI has had this issue for the past 7 months or so had some improvement with initial dilation but symptoms have returned and have been progressive for the last couple months. > Discussed with GI in the ED and plan to move up his intervention to tomorrow for more urgent evaluation and intervention considering his significantly worsening symptoms and weight loss. - Monitor on MedSurg unit with continuous pulse ox - Appreciate GI recommendations and assistance - N.p.o. - Status post 1 L IV fluids in the ED, hold off on further fluids for now  Hypertension - Holding home medications  GERD LPR - Holding PPI  DVT prophylaxis: SCDs Code Status:   Full Family Communication:  Updated at bedside  Disposition Plan:   Patient is from:  Home  Anticipated DC to:  Home  Anticipated DC date:  1 to 3 days  Anticipated DC barriers: None  Consults called:  Gastroenterology Admission status:  Observation, MedSurg with pulse ox  Severity of Illness: The appropriate patient status for this patient is OBSERVATION. Observation status is judged to be reasonable and necessary in order to provide the  required intensity of service to ensure the patient's safety. The patient's presenting symptoms, physical exam findings, and initial radiographic and laboratory data in the context of their medical condition is felt to place them at decreased risk for further clinical deterioration. Furthermore, it is anticipated that the patient will be medically stable for discharge from the hospital within 2 midnights of admission.    Johnetta Nab MD [facility]  How to contact the [facility] Attending or Consulting provider 7A - 7P or covering provider during after hours 7P -7A, for this patient?   Check the care team in [facility] and look for a) attending/consulting [facility] provider listed and b) the [facility] team listed Log into [email or web address] and use [facility]'s universal password to access. If you do not have the password, please contact the hospital operator. Locate the [facility] provider you are looking for under [facility] and page to a number that you can be directly reached. If you still have difficulty reaching the provider, please page the [facility] (Director on Call) for the Hospitalists listed on amion for assistance.  07/29/2023, 5:33 PM

## 2023-07-30 ENCOUNTER — Observation Stay (HOSPITAL_COMMUNITY): Admitting: Anesthesiology

## 2023-07-30 ENCOUNTER — Ambulatory Visit: Admitting: Gastroenterology

## 2023-07-30 ENCOUNTER — Other Ambulatory Visit (HOSPITAL_COMMUNITY): Payer: Self-pay

## 2023-07-30 ENCOUNTER — Encounter (HOSPITAL_COMMUNITY)
Admission: EM | Disposition: A | Discharge status: Home / Self Care | Payer: Self-pay | Source: Home / Self Care | Attending: Emergency Medicine

## 2023-07-30 ENCOUNTER — Encounter (HOSPITAL_COMMUNITY): Payer: Self-pay | Admitting: Internal Medicine

## 2023-07-30 ENCOUNTER — Telehealth: Payer: Self-pay

## 2023-07-30 DIAGNOSIS — K2270 Barrett's esophagus without dysplasia: Secondary | ICD-10-CM | POA: Diagnosis not present

## 2023-07-30 DIAGNOSIS — K222 Esophageal obstruction: Secondary | ICD-10-CM

## 2023-07-30 DIAGNOSIS — K219 Gastro-esophageal reflux disease without esophagitis: Secondary | ICD-10-CM | POA: Diagnosis not present

## 2023-07-30 DIAGNOSIS — R1310 Dysphagia, unspecified: Secondary | ICD-10-CM | POA: Diagnosis not present

## 2023-07-30 DIAGNOSIS — R140 Abdominal distension (gaseous): Secondary | ICD-10-CM

## 2023-07-30 DIAGNOSIS — I10 Essential (primary) hypertension: Secondary | ICD-10-CM | POA: Diagnosis not present

## 2023-07-30 DIAGNOSIS — R933 Abnormal findings on diagnostic imaging of other parts of digestive tract: Secondary | ICD-10-CM

## 2023-07-30 DIAGNOSIS — R634 Abnormal weight loss: Secondary | ICD-10-CM

## 2023-07-30 DIAGNOSIS — E785 Hyperlipidemia, unspecified: Secondary | ICD-10-CM | POA: Diagnosis not present

## 2023-07-30 DIAGNOSIS — K224 Dyskinesia of esophagus: Secondary | ICD-10-CM | POA: Diagnosis not present

## 2023-07-30 HISTORY — PX: BALLOON DILATION: SHX5330

## 2023-07-30 HISTORY — PX: BOTOX INJECTION: SHX5754

## 2023-07-30 HISTORY — PX: ESOPHAGOGASTRODUODENOSCOPY: SHX5428

## 2023-07-30 LAB — COMPREHENSIVE METABOLIC PANEL WITH GFR
ALT: 24 U/L (ref 0–44)
AST: 19 U/L (ref 15–41)
Albumin: 3.7 g/dL (ref 3.5–5.0)
Alkaline Phosphatase: 70 U/L (ref 38–126)
Anion gap: 12 (ref 5–15)
BUN: 10 mg/dL (ref 8–23)
CO2: 21 mmol/L — ABNORMAL LOW (ref 22–32)
Calcium: 9.4 mg/dL (ref 8.9–10.3)
Chloride: 103 mmol/L (ref 98–111)
Creatinine, Ser: 0.83 mg/dL (ref 0.61–1.24)
GFR, Estimated: >60 mL/min (ref >60)
Glucose, Bld: 95 mg/dL (ref 70–99)
Potassium: 3.5 mmol/L (ref 3.5–5.1)
Sodium: 136 mmol/L (ref 135–145)
Total Bilirubin: 0.9 mg/dL (ref 0.0–1.2)
Total Protein: 6.6 g/dL (ref 6.5–8.1)

## 2023-07-30 LAB — CBC
HCT: 41.4 % (ref 39.0–52.0)
Hemoglobin: 14.4 g/dL (ref 13.0–17.0)
MCH: 28.6 pg (ref 26.0–34.0)
MCHC: 34.8 g/dL (ref 30.0–36.0)
MCV: 82.1 fL (ref 80.0–100.0)
Platelets: 277 10*3/uL (ref 150–400)
RBC: 5.04 MIL/uL (ref 4.22–5.81)
RDW: 13 % (ref 11.5–15.5)
WBC: 7.7 10*3/uL (ref 4.0–10.5)
nRBC: 0 % (ref 0.0–0.2)

## 2023-07-30 SURGERY — EGD (ESOPHAGOGASTRODUODENOSCOPY)
Anesthesia: Monitor Anesthesia Care

## 2023-07-30 MED ORDER — SODIUM CHLORIDE (PF) 0.9 % IJ SOLN
INTRAMUSCULAR | Status: AC
Start: 2023-07-30 — End: ?
  Filled 2023-07-30: qty 10

## 2023-07-30 MED ORDER — PROPOFOL 500 MG/50ML IV EMUL
INTRAVENOUS | Status: DC | PRN
  Administered 2023-07-30: 125 ug/kg/min via INTRAVENOUS

## 2023-07-30 MED ORDER — SODIUM CHLORIDE (PF) 0.9 % IJ SOLN
100.0000 [IU] | Freq: Once | INTRAMUSCULAR | Status: DC
  Filled 2023-07-30: qty 100

## 2023-07-30 MED ORDER — PANTOPRAZOLE SODIUM 40 MG IV SOLR
40.0000 mg | INTRAVENOUS | Status: DC
  Administered 2023-07-30: 40 mg via INTRAVENOUS
  Filled 2023-07-30: qty 10

## 2023-07-30 MED ORDER — CALCIUM CARBONATE ANTACID 500 MG PO CHEW
1.0000 | CHEWABLE_TABLET | Freq: Every day | ORAL | 0 refills | Status: AC
  Filled 2023-07-30: qty 30, 30d supply, fill #0

## 2023-07-30 MED ORDER — SODIUM CHLORIDE (PF) 0.9 % IJ SOLN
INTRAMUSCULAR | Status: DC | PRN
  Administered 2023-07-30: 4 mL via SUBMUCOSAL

## 2023-07-30 MED ORDER — MIDAZOLAM HCL 2 MG/2ML IJ SOLN
INTRAMUSCULAR | Status: AC
  Filled 2023-07-30: qty 2

## 2023-07-30 MED ORDER — BOOST / RESOURCE BREEZE PO LIQD CUSTOM: 1.0000 | Freq: Three times a day (TID) | ORAL | Status: DC

## 2023-07-30 MED ORDER — MIDAZOLAM HCL 2 MG/2ML IJ SOLN
INTRAMUSCULAR | Status: DC | PRN
  Administered 2023-07-30: 2 mg via INTRAVENOUS

## 2023-07-30 NOTE — Progress Notes (Signed)
 PROGRESS NOTE    Frederick Weaver.  UJW:[id number] DOB: 1950-11-21 DOA: 07/29/2023 PCP: Benedetta Bradley, MD    Brief Narrative:  73 year old with history of hypertension, hyperlipidemia, GERD, esophageal dysmotility and progressive dysphagia who was getting GI workup as outpatient but continued to feel extremely difficult eating and swallowing so came to ER.  In the emergency room hemodynamically stable.  Admitted with GI consultation.  Subjective: Patient seen and examined.  He is extremely nervous.  He is talking about feeling of dying because of weight loss.  He looks comfortable otherwise.  Hemodynamics are stable. Wife at the bedside who was trembling and suspected low blood glucose.  Gave her orange juice and she felt better.  Wife was asking about MRI of his brain to look at his nerves.  Assessment & Plan:   Dysphagia: Progressive dysphagia with weight loss.  Known history of esophageal dysmotility and stricture. Admit due to significant symptoms. Upper GI endoscopy with dilatation and with Botox injection today.  Rest of the management as per GI.  Will monitor for diet tolerance after the procedure.  Chronic medical issues including Hypertension, holding antihypertensives GERD, on PPI.  On IV Protonix . Electrolytes are adequate.   DVT prophylaxis: SCDs Start: 07/29/23 1725   Code Status: Full code Family Communication: Wife at the bedside Disposition Plan: Status is: Observation The patient will require care spanning > 2 midnights and should be moved to inpatient because: Inpatient procedure and diet tolerance     Consultants:  Gastroenterology  Procedures:  EGD planned  Antimicrobials:  None     Objective: Vitals:   07/29/23 1948 07/30/23 0029 07/30/23 0446 07/30/23 0822  BP: (!) 159/88 103/65 139/74 (!) 153/104  Pulse: 70 69 65 89  Resp: 18 19 18 16   Temp: 98.2 F (36.8 C) 98.6 F (37 C) 98.1 F (36.7 C) 98.1 F (36.7 C)  TempSrc: Oral Oral Oral  Oral  SpO2: 100% 97% 97% 98%  Weight:      Height:        Intake/Output Summary (Last 24 hours) at 07/30/2023 1008 Last data filed at 07/29/2023 1840 Gross per 24 hour  Intake 1000 ml  Output --  Net 1000 ml   Filed Weights   07/29/23 1454  Weight: 78.5 kg    Examination:  General exam: Appears comfortable.  On room air.  Extremely anxious. Respiratory system: Clear to auscultation. Respiratory effort normal. Cardiovascular system: S1 & S2 heard, RRR. No JVD, murmurs, rubs, gallops or clicks. No pedal edema. Gastrointestinal system: Soft.  Nontender.  Bowel sound present.   Central nervous system: Alert and oriented. No focal neurological deficits. Extremities: Symmetric 5 x 5 power.    Data Reviewed: I have personally reviewed following labs and imaging studies  CBC: Recent Labs  Lab 07/24/23 1855 07/29/23 1505 07/30/23 0714  WBC 8.6 8.0 7.7  NEUTROABS 5.5  --   --   HGB 14.5 14.0 14.4  HCT 43.3 41.1 41.4  MCV 85.7 84.2 82.1  PLT 284 324 277   Basic Metabolic Panel: Recent Labs  Lab 07/24/23 1855 07/29/23 1505 07/30/23 0714  NA 134* 135 136  K 3.5 3.6 3.5  CL 99 101 103  CO2 26 21* 21*  GLUCOSE 109* 99 95  BUN 14 8 10   CREATININE 0.71 0.80 0.83  CALCIUM 9.3 9.5 9.4   GFR: Estimated Creatinine Clearance: 77.8 mL/min (by C-G formula based on SCr of 0.83 mg/dL). Liver Function Tests: Recent Labs  Lab 07/24/23  1855 07/29/23 1505 07/30/23 0714  AST 19 18 19   ALT 32 26 24  ALKPHOS 79 73 70  BILITOT 0.8 0.9 0.9  PROT 7.5 6.8 6.6  ALBUMIN 4.3 3.8 3.7   Recent Labs  Lab 07/24/23 1855  LIPASE 27   No results for input(s): "AMMONIA" in the last 168 hours. Coagulation Profile: No results for input(s): "INR", "PROTIME" in the last 168 hours. Cardiac Enzymes: No results for input(s): "CKTOTAL", "CKMB", "CKMBINDEX", "TROPONINI" in the last 168 hours. BNP (last 3 results) No results for input(s): "PROBNP" in the last 8760 hours. HbA1C: No results  for input(s): "HGBA1C" in the last 72 hours. CBG: No results for input(s): "GLUCAP" in the last 168 hours. Lipid Profile: No results for input(s): "CHOL", "HDL", "LDLCALC", "TRIG", "CHOLHDL", "LDLDIRECT" in the last 72 hours. Thyroid  Function Tests: No results for input(s): "TSH", "T4TOTAL", "FREET4", "T3FREE", "THYROIDAB" in the last 72 hours. Anemia Panel: No results for input(s): "VITAMINB12", "FOLATE", "FERRITIN", "TIBC", "IRON", "RETICCTPCT" in the last 72 hours. Sepsis Labs: No results for input(s): "PROCALCITON", "LATICACIDVEN" in the last 168 hours.  No results found for this or any previous visit (from the past 240 hours).       Radiology Studies: No results found.      Scheduled Meds:  botox 100 units/NS 4 mL for final conc. 25 units/mL mixture  100 Units Submucosal Once   [MAR Hold] pantoprazole  (PROTONIX ) IV  40 mg Intravenous Q24H   [MAR Hold] sodium chloride  flush  3 mL Intravenous Q12H   Continuous Infusions:   LOS: 0 days      Vada Garibaldi, MD [facility]

## 2023-07-30 NOTE — Telephone Encounter (Signed)
-----   Message from Nurse Dede Fanny sent at 07/30/2023  1:28 PM EDT ----- Regarding: FW: your patient  ----- Message ----- From: Ace Holder, MD Sent: 07/30/2023   1:23 PM EDT To: Daina Drum, MD; [facility] Triage Subject: your patient                                   Frederick Weaver, This patient came in for worsening dysphagia. I dilated him to 20mm and gave him a Botox injection. He did well and going home. I think he was on your schedule for later in May but can probably cancel that and just proceed with manometry.   POD A RN can you forward to Dr. Les Rao team so they can cancel his pending EGD? Thanks

## 2023-07-30 NOTE — Care Management Obs Status (Signed)
 MEDICARE OBSERVATION STATUS NOTIFICATION   Patient Details  Name: Frederick Weaver. MRN: [id number] Date of Birth: Nov 06, 1950   Medicare Observation Status Notification Given:  Yes    Jonathan Neighbor, RN 07/30/2023, 1:26 PM

## 2023-07-30 NOTE — Consult Note (Signed)
 Consultation Note   Referring Provider:  [facility] PCP: Benedetta Bradley, MD Primary Gastroenterologist::    Daina Drum, MD    Reason for Consultation:  Dysphagia and weight loss DOA: 07/29/2023         Hospital Day: 2   ASSESSMENT    73 yo male with PMH as listed below with chronic GERD with Barrett's esophagus / chronic dysphagia / esophageal stenosis / esophageal dysmotility on barium swallowing. Presents with significant weight loss and persistent dysphagia despite recent dilation of a benign esophageal stenosis.   See PMH for additional history     PLAN:   --Was scheduled to have repeat EGD with botox on 5/22. Due to worsening symptoms will proceed inpatient.  The risks and benefits of EGD with possible biopsies were discussed with the patient who agrees to proceed.  --Pantoprazole  IV Q 24. Though causes him some abdominal cramping he feels it is tolerable.    HPI   73 year old male with a history of obesity, osteoarthritis, sleep apnea, GERD with Barrett's, esophageal dysmotility, diverticulosis  Brief GI history  Patient has a longstanding history of chronic intermittent dysphagia.  Most recently , he had an EGD with esophageal dilation in October 2024.  He had recurrence of symptoms and was seen in the office on 06/07/23 at which time he was scheduled for a barium swallow as well as a repeat EGD with dilation.  Barium swallow showed esophageal dysmotility and a subtle smooth walled narrowing of the GE junction, see full report below. Repeat EGD on 06/19/23 which showed benign appearing esophageal stenosis status post dilation.    Patient returned to the office on 06/28/2023.  We had started him on baclofen  at one time but this had to be continued due to constipation.  We had changed him from pantoprazole  to Voquezna  but this had to be discontinued due to heart palpitations so he was restarted on pantoprazole .  Pantoprazole  causing stomach cramping but he says it is tolerable. For ongoing complaints of dysphagia as well as weight loss he was scheduled for an esophageal manometry as well as repeat EGD with Botox at the GE junction ( both pending). In the interim he went to the ED on 07/24/2023 with abdominal pain.  CTAP without contrast was negative for any acute findings  Interval History  Currently scheduled for EGD with Dr. Rosaline Coma at [facility] unit on 08/09/2023, along with esophageal manometry on 10/24/2023.  Patient called the answering service yesterday with worsening symptoms. Felt like air was trapped in esophagus.  Felt like he could not wait until his manometry next EGD so he was directed to the ED. He cannot say for sure whether food / fluids clear his esophagus but he feels very bloated. Air definitely feels like it gets trapped in his esophagus. He has to force himself to belch to get some relief. His abdomen feels bloated. He had a BM two days ago. He has lost ~ 30 pounds over the last two months due to symptoms. He feels miserable and like he will die if something doesn't get done today  Previous GI Studies    In 2005, colonoscopy performed with removal of a 2 mm adenomatous cecal polyp  10/22/2008 normal surveillance colonoscopy performed.    Had a normal colonoscopy in 2016   Colonoscopy 01/06/22: 8 4-6 colon polyps that were removed. Diverticulosis in the sigmoid, descending, transverse colon, and ascenidng colon. Repeat colonoscopy recommended in 3 years.   EGD 01/17/23: - One benign- appearing, intrinsic moderate ( circumferential scarring or stenosis; an endoscope may pass) stenosis was found at the gastroesophageal junction. This stenosis measured less than one cm ( in length) . The stenosis was traversed. A TTS dilator was passed through the scope. Dilation with an 18- 19- 20 mm balloon dilator was performed to 20 mm. - Biopsies were taken with a cold forceps in the proximal  esophagus and in the distal esophagus for histology. - 12 5 to 12 mm sessile polyps with no bleeding and no stigmata of recent bleeding were found in the gastric body. These polyps were removed with a cold snare. Resection and retrieval were complete. - Localized inflammation characterized by congestion ( edema) and erythema was found in the gastric antrum. Biopsies were taken with a cold forceps for histology. - A single 8 mm sessile polyp with no bleeding was found in the ampulla. Biopsies were taken with a cold forceps for histology. Path:  1. Surgical [P], ampulla polyp :      -  SMALL INTESTINAL MUCOSA WITH PROMINENT FOVEOLAR METAPLASIA AND      EROSION/ULCERATION SUGGESTIVE OF CHRONIC PEPTIC DUODENITIS, CLINICAL/ENDOSCOPIC      CORRELATION RECOMMENDED.      2. Surgical [P], gastric :      -  ANTRAL AND OXYNTIC MUCOSA WITH MILD CHEMICAL/REACTIVE CHANGE.      -  NO HELICOBACTER PYLORI ORGANISMS IDENTIFIED ON H&E STAINED SLIDE.      3. Surgical [P], gastric polyps, polyp (12) :      -  MIXED FUNDIC GLAND AND HYPERPLASTIC POLYPS (PREDOMINANTLY FUNDIC GLAND) WITH      EVIDENCE OF SUBMUCOSAL INVOLVEMENT (I.E.GASTRITIS CYSTICA POLYPOSA).      -  NEGATIVE FOR DYSPLASIA.      4. Surgical [P], esophagus :      -  SQUAMOCOLUMNAR JUNCTIONAL MUCOSA WITH FOCAL INTESTINAL METAPLASIA CONSISTENT      WITH BARRETT'S ESOPHAGUS IN THE APPROPRIATE ENDOSCOPIC SETTING.      -  NEGATIVE FOR DYSPLASIA.   06/21/2023 Barium swallow FINDINGS: Swallowing: Normal Pharynx: Unremarkable. Esophagus: Subtle smooth walled narrowing of the GE junction. Esophageal motility: Evidence of dysmotility in the mid to distal esophagus. Hiatal Hernia: None. Gastroesophageal reflux: No evidence of provoked reflux. Negative water siphon test.Ingested 13mm barium tablet: Passed down the esophagus normally, but stayed idle at the GE junction.Other: Pooling of barium in the vallecula and piriform fossa, which eventually cleared with  swallowing.  IMPRESSION: Esophagram performed with evidence of esophageal dysmotility and subtle smooth walled narrowing at the GE junction. Electronically Signed   By: Beula Brunswick M.D.   On: 06/21/2023 10:12    06/19/23 EGD - Benign-appearing esophageal stenosis. Dilated. Biopsied. - Two gastric polyps. Resected and retrieved.  Examined duodenum was normal FINAL DIAGNOSIS        1. Surgical [P], GE junction :       GASTROESOPHAGEAL MUCOSA WITH INTESTINAL METAPLASIA CONSISTENT WITH BARRETT'S       ESOPHAGUS.       NEGATIVE FOR DYSPLASIA.        2. Surgical [P], gastric polyp, polyp (2) :       FUNDIC GLAND POLYPS (2).       NEGATIVE FOR DYSPLASIA  AND MALIGNANCY.       Labs and Imaging:  Recent Labs    07/29/23 1505  WBC 8.0  HGB 14.0  HCT 41.1  MCV 84.2  PLT 324   No results for input(s): "FOLATE", "VITAMINB12", "FERRITIN", "TIBC", "IRONPCTSAT" in the last 72 hours. Recent Labs    07/29/23 1505  NA 135  K 3.6  CL 101  CO2 21*  GLUCOSE 99  BUN 8  CREATININE 0.80  CALCIUM 9.5   Recent Labs    07/29/23 1505  PROT 6.8  ALBUMIN 3.8  AST 18  ALT 26  ALKPHOS 73  BILITOT 0.9   No results for input(s): "INR" in the last 72 hours. No results for input(s): "AFPTUMOR" in the last 72 hours.  CT ABDOMEN PELVIS W CONTRAST CLINICAL DATA:  Acute abdominal pain, possible food bolus within the esophagus.  EXAM: CT ABDOMEN AND PELVIS WITH CONTRAST  TECHNIQUE: Multidetector CT imaging of the abdomen and pelvis was performed using the standard protocol following bolus administration of intravenous contrast.  RADIATION DOSE REDUCTION: This exam was performed according to the departmental dose-optimization program which includes automated exposure control, adjustment of the mA and/or kV according to patient size and/or use of iterative reconstruction technique.  CONTRAST:  OMNIPAQUE  IOHEXOL  300 MG/ML  SOLN  COMPARISON:  Lung bases  are  FINDINGS: Lower chest: Lung bases are free of acute infiltrate or effusion.  Hepatobiliary: No focal liver abnormality is seen. No gallstones, gallbladder wall thickening, or biliary dilatation.  Pancreas: Unremarkable. No pancreatic ductal dilatation or surrounding inflammatory changes.  Spleen: Normal in size without focal abnormality.  Adrenals/Urinary Tract: Adrenal glands are within normal limits. Kidneys are well visualized bilaterally. Normal excretion is noted on delayed images. Small cyst is noted in the left kidney. No follow-up is recommended. The bladder is decompressed.  Stomach/Bowel: Scattered diverticular change of the colon is noted without evidence of diverticulitis. Fecal material is noted throughout the colon. Some inspissated barium is noted within the appendix although the appendix is otherwise within normal limits. Small bowel and stomach are unremarkable. The visualized esophagus is within normal limits. No definitive retained food bolus is seen.  Vascular/Lymphatic: Aortic atherosclerosis. No enlarged abdominal or pelvic lymph nodes.  Reproductive: Prostate is unremarkable.  Other: No abdominal wall hernia or abnormality. No abdominopelvic ascites.  Musculoskeletal: Degenerative changes of lumbar spine are noted.  IMPRESSION: No evidence of retained food bolus within the distal esophagus.  Diverticulosis without diverticulitis.  Electronically Signed   By: Violeta Grey M.D.   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   Hyperlipidemia    Hypertension    Seasonal allergies     Past Surgical History:  Procedure Laterality Date   COLONOSCOPY WITH PROPOFOL  N/A 11/09/2014   Procedure: COLONOSCOPY WITH PROPOFOL ;  Surgeon: Garrett Kallman, MD;  Location: [facility] ENDOSCOPY;   Service: Endoscopy;  Laterality: N/A;   HERNIA REPAIR  03/20/1970   inguinal   LYMPH NODE BIOPSY  03/21/1971   benign    Family History  Problem Relation Age of Onset   Breast cancer Mother    Colon polyps Mother    Heart disease Mother    Congestive Heart Failure Mother    Lung cancer Father  smoker but had quit years before   Colon cancer Neg Hx    Esophageal cancer Neg Hx    Rectal cancer Neg Hx    Stomach cancer Neg Hx     Prior to Admission medications   Medication Sig Start Date End Date Taking? Authorizing Provider  aluminum-magnesium  hydroxide 200-200 MG/5ML suspension Take 20 mLs by mouth every 6 (six) hours as needed for indigestion.   Yes [provider]  azithromycin (ZITHROMAX) 250 MG tablet Take 250 mg by mouth daily. Take 2 tablets by mouth day one.  Take 1 tablet on days 2-5 07/24/23 07/31/23 Yes [provider]  cholecalciferol (VITAMIN D3) 25 MCG (1000 UNIT) tablet 1 tablet Orally Once a day   Yes [provider]  dexlansoprazole  (DEXILANT ) 60 MG capsule Take 1 capsule (60 mg total) by mouth daily. 07/16/23  Yes May, Deanna J, NP  fexofenadine (ALLEGRA) 180 MG tablet Take 180 mg by mouth daily.   Yes [provider]  losartan-hydrochlorothiazide (HYZAAR) 100-12.5 MG tablet Take 1 tablet by mouth daily. 04/28/21  Yes [provider]  pantoprazole  (PROTONIX ) 40 MG tablet Take 40 mg by mouth daily.   Yes [provider]  [facility] injection  11/10/22   [provider]    Current Facility-Administered Medications  Medication Dose Route Frequency Provider Last Rate Last Admin   acetaminophen (TYLENOL) tablet 650 mg  650 mg Oral Q6H PRN Johnetta Nab, MD       Or   acetaminophen (TYLENOL) suppository 650 mg  650 mg Rectal Q6H PRN Johnetta Nab, MD       polyethylene glycol (MIRALAX / GLYCOLAX) packet 17 g  17 g Oral Daily PRN Johnetta Nab, MD       sodium chloride  flush (NS) 0.9 %  injection 3 mL  3 mL Intravenous Q12H Johnetta Nab, MD   3 mL at 07/29/23 2138    Allergies as of 07/29/2023 - Review Complete 07/29/2023  Allergen Reaction Noted   Coreg [carvedilol] Other (See Comments) 07/29/2023   Ampicillin Rash 11/09/2014   Codeine Nausea Only 10/21/2014   Demerol [meperidine] Nausea Only 11/09/2014   Doxycycline Rash 11/09/2014   Metoprolol Palpitations 07/29/2023   Penicillins Rash 11/09/2014    Social History   Socioeconomic History   Marital status: Married    Spouse name: Siri Duet   Number of children: 2   Years of education: Not on file   Highest education level: Not on file  Occupational History   Not on file  Tobacco Use   Smoking status: Never   Smokeless tobacco: Never  Vaping Use   Vaping status: Never Used  Substance and Sexual Activity   Alcohol use: No   Drug use: No   Sexual activity: Not on file  Other Topics Concern   Not on file  Social History Narrative   Not on file   Social Drivers of Health   Financial Resource Strain: Not on file  Food Insecurity: No Food Insecurity (07/29/2023)   Hunger Vital Sign    Worried About Running Out of Food in the Last Year: Never true    Ran Out of Food in the Last Year: Never true  Transportation Needs: No Transportation Needs (07/29/2023)   PRAPARE - Administrator, Civil Service (Medical): No    Lack of Transportation (Non-Medical): No  Physical Activity: Not on file  Stress: Not on file  Social Connections: Socially Integrated (07/29/2023)   Social Connection  and Isolation Panel [NHANES]    Frequency of Communication with Friends and Family: More than three times a week    Frequency of Social Gatherings with Friends and Family: More than three times a week    Attends Religious Services: 1 to 4 times per year    Active Member of [employer] or Organizations: Patient declined    Attends Banker Meetings: 1 to 4 times per year    Marital Status: Married  Careers information officer Violence: Not At Risk (07/29/2023)   Humiliation, Afraid, Rape, and Kick questionnaire    Fear of Current or Ex-Partner: No    Emotionally Abused: No    Physically Abused: No    Sexually Abused: No     Code Status   Code Status: Full Code  Review of Systems: All systems reviewed and negative except where noted in HPI.  Physical Exam: Vital signs in last 24 hours: Temp:  [97.8 F (36.6 C)-98.6 F (37 C)] 98.1 F (36.7 C) (05/12 0446) Pulse Rate:  [57-87] 65 (05/12 0446) Resp:  [18-20] 18 (05/12 0446) BP: (103-159)/(65-92) 139/74 (05/12 0446) SpO2:  [97 %-100 %] 97 % (05/12 0446) Weight:  [78.5 kg] 78.5 kg (05/11 1454)    General:  Male in NAD Psych:  Cooperative, anxious.  Eyes: Pupils equal Ears:  Normal auditory acuity Nose: No deformity, discharge or lesions Mouth: no white plaques Neck:  Supple, no masses felt Lungs:  Clear to auscultation.  Heart:  Regular rate, regular rhythm.  Abdomen:  Soft, protuberant,  nontender, active bowel sounds, no masses felt Rectal :  Deferred Msk: Symmetrical without gross deformities.  Neurologic:  Alert, oriented, grossly normal neurologically Extremities : No edema Skin:  Intact without significant lesions.    Intake/Output from previous day: 05/11 0701 - 05/12 0700 In: 1000 [IV Piggyback:1000] Out: -  Intake/Output this shift:  No intake/output data recorded.   Mai Schwalbe, NP-C   07/30/2023, 8:20 AM

## 2023-07-30 NOTE — Anesthesia Preprocedure Evaluation (Signed)
 Anesthesia Evaluation  Patient identified by MRN, date of birth, ID band Patient awake    Reviewed: Allergy & Precautions, NPO status , Patient's Chart, lab work & pertinent test results  Airway Mallampati: II  TM Distance: >3 FB Neck ROM: Full    Dental  (+) Dental Advisory Given   Pulmonary neg pulmonary ROS   breath sounds clear to auscultation       Cardiovascular hypertension, Pt. on medications  Rhythm:Regular Rate:Normal     Neuro/Psych negative neurological ROS     GI/Hepatic Neg liver ROS,GERD  ,,dysphagia   Endo/Other  negative endocrine ROS    Renal/GU Renal disease     Musculoskeletal   Abdominal   Peds  Hematology negative hematology ROS (+)   Anesthesia Other Findings   Reproductive/Obstetrics                             Anesthesia Physical Anesthesia Plan  ASA: 2  Anesthesia Plan: MAC   Post-op Pain Management:    Induction:   PONV Risk Score and Plan: 1 and Propofol  infusion  Airway Management Planned: Natural Airway and Nasal Cannula  Additional Equipment:   Intra-op Plan:   Post-operative Plan:   Informed Consent: I have reviewed the patients History and Physical, chart, labs and discussed the procedure including the risks, benefits and alternatives for the proposed anesthesia with the patient or authorized representative who has indicated his/her understanding and acceptance.       Plan Discussed with:   Anesthesia Plan Comments:        Anesthesia Quick Evaluation

## 2023-07-30 NOTE — Op Note (Signed)
 [facility] Patient Name: Frederick Weaver Procedure Date : 07/30/2023 MRN: [id number] Attending MD: Lendon Queen. General Kenner , MD, [id number] Date of Birth: April 21, 1950 CSN: [id number] Age: 73 Admit Type: Inpatient Procedure:                Upper GI endoscopy Indications:              Dysphagia - history of GEJ stenosis s/p dilation in                            the past, also with dysmotility on barium study,                            manometry pending to evaluate for achalasia.                            Admitted with worsening dysphagia / worsening PO                            intake, weight loss Providers:                Landon Pinion P. General Kenner, MD, Suzann Ernst, RN, Nohemi Batters, Technician Referring MD:              Medicines:                Monitored Anesthesia Care Complications:            No immediate complications. Estimated blood loss:                            Minimal. Estimated Blood Loss:     Estimated blood loss was minimal. Procedure:                Pre-Anesthesia Assessment:                           - Prior to the procedure, a History and Physical                            was performed, and patient medications and                            allergies were reviewed. The patient's tolerance of                            previous anesthesia was also reviewed. The risks                            and benefits of the procedure and the sedation                            options and risks were discussed with the patient.  All questions were answered, and informed consent                            was obtained. Prior Anticoagulants: The patient has                            taken no anticoagulant or antiplatelet agents. ASA                            Grade Assessment: II - A patient with mild systemic                            disease. After reviewing the risks and benefits,                            the patient  was deemed in satisfactory condition to                            undergo the procedure.                           After obtaining informed consent, the endoscope was                            passed under direct vision. Throughout the                            procedure, the patient's blood pressure, pulse, and                            oxygen saturations were monitored continuously. The                            [id number]-[id number] ([id number]) Olympus endoscope was introduced                            through the mouth, and advanced to the second part                            of duodenum. The upper GI endoscopy was                            accomplished without difficulty. The patient                            tolerated the procedure well. Scope In: Scope Out: Findings:      The Z-line was regular.      One benign-appearing, intrinsic mild stenosis was found at the GEJ This       stenosis measured less than one cm (in length). A TTS dilator was passed       through the scope. Dilation with an 18-19-20 mm balloon dilator was       performed to 18 mm, 19 mm and 20 mm. Very mild mucosal dilation effect  noted only at 20mm balloon dilation. Following dilation, area 1cm above       the SCJ was successfully injected with 100 units botulinum toxin (4       injection of 25 units / 1 ml each).      The exam of the esophagus was otherwise normal.      The entire examined stomach was normal.      The examined duodenum was normal. Impression:               - Z-line regular.                           - Benign-appearing esophageal stenosis. Dilated to                            20mm and 1cm proximal to SCJ was injected with                            botulinum toxin.                           - Normal stomach.                           - Normal examined duodenum. Recommendation:           - Return patient to hospital ward for ongoing care.                           - Advance diet as tolerated -  start with liquids                            and advance as tolerated                           - Continue present medications.                           - If patient is tolerating liquids okay can go home                            later today and follow up with Dr. Rosaline Coma Procedure Code(s):        --- Professional ---                           [phone], Esophagogastroduodenoscopy, flexible,                            transoral; with transendoscopic balloon dilation of                            esophagus (less than 30 mm diameter)                           [id number], 59, Esophagogastroduodenoscopy, flexible,  transoral; with directed submucosal injection(s),                            any substance Diagnosis Code(s):        --- Professional ---                           K22.2, Esophageal obstruction                           R13.10, Dysphagia, unspecified CPT copyright 2022 American Medical Association. All rights reserved. The codes documented in this report are preliminary and upon coder review may  be revised to meet current compliance requirements. Landon Pinion P. Karina Nofsinger, MD 07/30/2023 11:52:26 AM This report has been signed electronically. Number of Addenda: 0

## 2023-07-30 NOTE — Telephone Encounter (Signed)
 5/22 hospital EGD has been cancelled

## 2023-07-30 NOTE — Progress Notes (Signed)
Patient off the unit for EGD.

## 2023-07-30 NOTE — Progress Notes (Signed)
 AVS instructions given, patient verbalized understanding, ready for discharge

## 2023-07-30 NOTE — Anesthesia Postprocedure Evaluation (Signed)
 Anesthesia Post Note  Patient: Frederick Weaver.  Procedure(s) Performed: EGD (ESOPHAGOGASTRODUODENOSCOPY) BALLOON DILATION BOTOX INJECTION     Patient location during evaluation: PACU Anesthesia Type: MAC Level of consciousness: awake and alert Pain management: pain level controlled Vital Signs Assessment: post-procedure vital signs reviewed and stable Respiratory status: spontaneous breathing, nonlabored ventilation, respiratory function stable and patient connected to nasal cannula oxygen Cardiovascular status: stable and blood pressure returned to baseline Postop Assessment: no apparent nausea or vomiting Anesthetic complications: no  No notable events documented.  Last Vitals:  Vitals:   07/30/23 1200 07/30/23 1230  BP: 115/88 (!) 153/76  Pulse: 93 64  Resp: 19 16  Temp:  36.7 C  SpO2: 97% 98%    Last Pain:  Vitals:   07/30/23 1230  TempSrc: Axillary  PainSc:                  Melvenia Stabs

## 2023-07-30 NOTE — Telephone Encounter (Signed)
 Patient spoke with on call provider and is currently admitted. See alternate telephone encounter for details

## 2023-07-30 NOTE — Plan of Care (Signed)

## 2023-07-30 NOTE — Discharge Summary (Signed)
 Physician Discharge Summary  Frederick Weaver. ZOX:[id number] DOB: 1950/06/28 DOA: 07/29/2023  PCP: Benedetta Bradley, MD  Admit date: 07/29/2023 Discharge date: 07/30/2023  Admitted From: Home Disposition: Home  Recommendations for Outpatient Follow-up:  Follow up with PCP in 1-2 weeks GI to schedule follow-up  Home Health: N/A Equipment/Devices: N/A  Discharge Condition: Stable CODE STATUS: Full code Diet recommendation: Liquid diet and soft diet as tolerated  Discharge summary: 73 year old with history of hypertension, hyperlipidemia, GERD, esophageal dysmotility and progressive dysphagia who was getting GI workup as outpatient but continued to feel extremely difficult eating and swallowing so came to ER. In the emergency room hemodynamically stable. Admitted with GI consultation.  Patient underwent upper GI endoscopy today with dilatation to 20 mm and injection with botulinum toxin.  He is tolerating liquid diet now.  His procedure has been done.  Stable.  Willing to go home.  He will continue on liquid and soft diet and continue to follow-up with gastroenterology.  Stable for discharge.   Discharge Diagnoses:  Principal Problem:   Dysphagia Active Problems:   Laryngopharyngeal reflux (LPR)   Esophageal dysmotility   HTN (hypertension)   HLD (hyperlipidemia)   GERD (gastroesophageal reflux disease)   Abnormal barium swallow   Bloating   Esophageal stricture    Discharge Instructions  Discharge Instructions     Diet - low sodium heart healthy   Complete by: As directed    Stay on liquid diet and soft diet as tolerated.   Increase activity slowly   Complete by: As directed       Allergies as of 07/30/2023       Reactions   Coreg [carvedilol] Other (See Comments)   CONGRESTION   Niacin    Becomes flushed    Ampicillin Rash   Codeine Nausea Only   Demerol [meperidine] Nausea Only   Doxycycline Rash   Metoprolol Palpitations   Penicillins Rash         Medication List     TAKE these medications    aluminum-magnesium  hydroxide 200-200 MG/5ML suspension Take 20 mLs by mouth every 6 (six) hours as needed for indigestion.   azithromycin 250 MG tablet Commonly known as: ZITHROMAX Take 250 mg by mouth daily. Take 2 tablets by mouth day one.  Take 1 tablet on days 2-5   cholecalciferol 25 MCG (1000 UNIT) tablet Commonly known as: VITAMIN D3 1 tablet Orally Once a day   dexlansoprazole  60 MG capsule Commonly known as: DEXILANT  Take 1 capsule (60 mg total) by mouth daily.   fexofenadine 180 MG tablet Commonly known as: ALLEGRA Take 180 mg by mouth daily.   losartan-hydrochlorothiazide 100-12.5 MG tablet Commonly known as: HYZAAR Take 1 tablet by mouth daily.   pantoprazole  40 MG tablet Commonly known as: PROTONIX  Take 40 mg by mouth daily.   Shingrix injection Generic drug: Zoster Vaccine Adjuvanted        Allergies  Allergen Reactions   Coreg [Carvedilol] Other (See Comments)    CONGRESTION   Niacin     Becomes flushed    Ampicillin Rash   Codeine Nausea Only   Demerol [Meperidine] Nausea Only   Doxycycline Rash   Metoprolol Palpitations   Penicillins Rash    Consultations: Gastroenterology   Procedures/Studies: CT ABDOMEN PELVIS W CONTRAST Result Date: 07/24/2023 CLINICAL DATA:  Acute abdominal pain, possible food bolus within the esophagus. EXAM: CT ABDOMEN AND PELVIS WITH CONTRAST TECHNIQUE: Multidetector CT imaging of the abdomen and pelvis was performed using the  standard protocol following bolus administration of intravenous contrast. RADIATION DOSE REDUCTION: This exam was performed according to the departmental dose-optimization program which includes automated exposure control, adjustment of the mA and/or kV according to patient size and/or use of iterative reconstruction technique. CONTRAST:  OMNIPAQUE  IOHEXOL  300 MG/ML  SOLN COMPARISON:  Lung bases are FINDINGS: Lower chest: Lung bases are free  of acute infiltrate or effusion. Hepatobiliary: No focal liver abnormality is seen. No gallstones, gallbladder wall thickening, or biliary dilatation. Pancreas: Unremarkable. No pancreatic ductal dilatation or surrounding inflammatory changes. Spleen: Normal in size without focal abnormality. Adrenals/Urinary Tract: Adrenal glands are within normal limits. Kidneys are well visualized bilaterally. Normal excretion is noted on delayed images. Small cyst is noted in the left kidney. No follow-up is recommended. The bladder is decompressed. Stomach/Bowel: Scattered diverticular change of the colon is noted without evidence of diverticulitis. Fecal material is noted throughout the colon. Some inspissated barium is noted within the appendix although the appendix is otherwise within normal limits. Small bowel and stomach are unremarkable. The visualized esophagus is within normal limits. No definitive retained food bolus is seen. Vascular/Lymphatic: Aortic atherosclerosis. No enlarged abdominal or pelvic lymph nodes. Reproductive: Prostate is unremarkable. Other: No abdominal wall hernia or abnormality. No abdominopelvic ascites. Musculoskeletal: Degenerative changes of lumbar spine are noted. IMPRESSION: No evidence of retained food bolus within the distal esophagus. Diverticulosis without diverticulitis. Electronically Signed   By: Violeta Grey M.D.   On: 07/24/2023 20:27   (Echo, Carotid, EGD, Colonoscopy, ERCP)    Subjective: Seen in the morning rounds.  See progress note.  Notified by GI that patient can go home.   Discharge Exam: Vitals:   07/30/23 1200 07/30/23 1230  BP: 115/88 (!) 153/76  Pulse: 93 64  Resp: 19 16  Temp:  98 F (36.7 C)  SpO2: 97% 98%   Vitals:   07/30/23 1147 07/30/23 1150 07/30/23 1200 07/30/23 1230  BP: 110/71 110/71 115/88 (!) 153/76  Pulse: 86 90 93 64  Resp: 17 19 19 16   Temp: (!) 97 F (36.1 C)   98 F (36.7 C)  TempSrc: Temporal   Axillary  SpO2: 99% 99% 97% 98%   Weight:      Height:       During morning rounds. General: Pt is alert, awake, not in acute distress, anxious. Cardiovascular: RRR, S1/S2 +, no rubs, no gallops Respiratory: CTA bilaterally, no wheezing, no rhonchi Abdominal: Soft, NT, ND, bowel sounds + Extremities: no edema, no cyanosis    The results of significant diagnostics from this hospitalization (including imaging, microbiology, ancillary and laboratory) are listed below for reference.     Microbiology: No results found for this or any previous visit (from the past 240 hours).   Labs: BNP (last 3 results) No results for input(s): "BNP" in the last 8760 hours. Basic Metabolic Panel: Recent Labs  Lab 07/24/23 1855 07/29/23 1505 07/30/23 0714  NA 134* 135 136  K 3.5 3.6 3.5  CL 99 101 103  CO2 26 21* 21*  GLUCOSE 109* 99 95  BUN 14 8 10   CREATININE 0.71 0.80 0.83  CALCIUM 9.3 9.5 9.4   Liver Function Tests: Recent Labs  Lab 07/24/23 1855 07/29/23 1505 07/30/23 0714  AST 19 18 19   ALT 32 26 24  ALKPHOS 79 73 70  BILITOT 0.8 0.9 0.9  PROT 7.5 6.8 6.6  ALBUMIN 4.3 3.8 3.7   Recent Labs  Lab 07/24/23 1855  LIPASE 27   No results  for input(s): "AMMONIA" in the last 168 hours. CBC: Recent Labs  Lab 07/24/23 1855 07/29/23 1505 07/30/23 0714  WBC 8.6 8.0 7.7  NEUTROABS 5.5  --   --   HGB 14.5 14.0 14.4  HCT 43.3 41.1 41.4  MCV 85.7 84.2 82.1  PLT 284 324 277   Cardiac Enzymes: No results for input(s): "CKTOTAL", "CKMB", "CKMBINDEX", "TROPONINI" in the last 168 hours. BNP: Invalid input(s): "POCBNP" CBG: No results for input(s): "GLUCAP" in the last 168 hours. D-Dimer No results for input(s): "DDIMER" in the last 72 hours. Hgb A1c No results for input(s): "HGBA1C" in the last 72 hours. Lipid Profile No results for input(s): "CHOL", "HDL", "LDLCALC", "TRIG", "CHOLHDL", "LDLDIRECT" in the last 72 hours. Thyroid  function studies No results for input(s): "TSH", "T4TOTAL", "T3FREE",  "THYROIDAB" in the last 72 hours.  Invalid input(s): "FREET3" Anemia work up No results for input(s): "VITAMINB12", "FOLATE", "FERRITIN", "TIBC", "IRON", "RETICCTPCT" in the last 72 hours. Urinalysis No results found for: "COLORURINE", "APPEARANCEUR", "LABSPEC", "PHURINE", "GLUCOSEU", "HGBUR", "BILIRUBINUR", "KETONESUR", "PROTEINUR", "UROBILINOGEN", "NITRITE", "LEUKOCYTESUR" Sepsis Labs Recent Labs  Lab 07/24/23 1855 07/29/23 1505 07/30/23 0714  WBC 8.6 8.0 7.7   Microbiology No results found for this or any previous visit (from the past 240 hours).   Time coordinating discharge: 32 minutes  SIGNED:   Vada Garibaldi, MD  [facility] 07/30/2023, 1:31 PM

## 2023-07-30 NOTE — Transfer of Care (Signed)
 Immediate Anesthesia Transfer of Care Note  Patient: Frederick Weaver.  Procedure(s) Performed: EGD (ESOPHAGOGASTRODUODENOSCOPY)  Patient Location: PACU  Anesthesia Type:MAC  Level of Consciousness: awake and drowsy  Airway & Oxygen Therapy: Patient Spontanous Breathing and Patient connected to face mask oxygen  Post-op Assessment: Report given to RN and Post -op Vital signs reviewed and stable  Post vital signs: Reviewed and stable  Last Vitals:  Vitals Value Taken Time  BP 110/71 07/30/23 1147  Temp 36.1 C 07/30/23 1147  Pulse 90 07/30/23 1148  Resp 18 07/30/23 1148  SpO2 99 % 07/30/23 1148  Vitals shown include unfiled device data.  Last Pain:  Vitals:   07/30/23 1147  TempSrc: Temporal  PainSc: Asleep         Complications: No notable events documented.

## 2023-07-30 NOTE — Progress Notes (Signed)
 Patient back to the unit, alert and oriented, vital signs stable, will continue to monitor

## 2023-07-30 NOTE — TOC Transition Note (Signed)
 Transition of Care [facility]) - Discharge Note   Patient Details  Name: Frederick Weaver. MRN: [id number] Date of Birth: 13-Apr-1950  Transition of [facility] CM/SW Contact:  Jonathan Neighbor, RN Phone Number: 07/30/2023, 1:32 PM   Clinical Narrative:     Pt is discharging home with self care. No needs per TOC.   Final next level of care: Home/Self Care Barriers to Discharge: No Barriers Identified   Patient Goals and CMS Choice            Discharge Placement                       Discharge Plan and Services Additional resources added to the After Visit Summary for                                       Social Drivers of Health (SDOH) Interventions SDOH Screenings   Food Insecurity: No Food Insecurity (07/29/2023)  Housing: Low Risk  (07/29/2023)  Transportation Needs: No Transportation Needs (07/29/2023)  Utilities: Not At Risk (07/29/2023)  Social Connections: Socially Integrated (07/29/2023)  Tobacco Use: Low Risk  (07/30/2023)     Readmission Risk Interventions     No data to display

## 2023-08-01 ENCOUNTER — Encounter (HOSPITAL_COMMUNITY): Payer: Self-pay | Admitting: Gastroenterology

## 2023-08-01 ENCOUNTER — Telehealth: Payer: Self-pay | Admitting: Internal Medicine

## 2023-08-01 DIAGNOSIS — R21 Rash and other nonspecific skin eruption: Secondary | ICD-10-CM | POA: Diagnosis not present

## 2023-08-01 NOTE — Telephone Encounter (Signed)
 Patient called and stated that he received a Botox injection whenever he was at the hospital and had an EGD done. Patient is now complaining of a red rash on his neck. Please advise.

## 2023-08-02 ENCOUNTER — Encounter: Payer: Self-pay | Admitting: Internal Medicine

## 2023-08-02 ENCOUNTER — Telehealth: Payer: Self-pay | Admitting: Internal Medicine

## 2023-08-02 NOTE — Telephone Encounter (Signed)
 Refer to phone note 5/14 & 5/15. Pt has called in twice to discuss this & message has been sent to [facility], NP for review. He is aware she is currently in clinic with patients & once reviewed we will be in touch.

## 2023-08-02 NOTE — Telephone Encounter (Signed)
 Patient called and stated that he was told to take Ibgaurd for his gas. Patient stated reading the box it say that if you have acid reflux do not take. Patient is no unsure weather he is able to take the medication. Patient is requesting a call back. Please advise.

## 2023-08-02 NOTE — Telephone Encounter (Signed)
 Returned call to patient & the rash on his neck has resolved. He had EGD w/botox on 07/30/23. He is more concerned about the gas he is experiencing. ED prescribed him 500 MG of TUMS to take daily, no relief. He's also taking gas X 1-2 times daily PRN. Advised him he could try IBGard OTC as another option & stick with low fiber diet for now. He's requested a referral to a nutritionist to help manage diet d/t gas & that I discuss with provider next options to help relieve gas. He's very anxious that he will be back at the ED d/t gas. Last OV w/Deanna, NP on 06/28/23.

## 2023-08-02 NOTE — Telephone Encounter (Signed)
 Refer to alternate phone note 08/01/23.

## 2023-08-02 NOTE — Telephone Encounter (Signed)
 Refer to phone note 08/01/23.

## 2023-08-02 NOTE — Telephone Encounter (Signed)
Mychart message sent to patient with recommendations.

## 2023-08-03 ENCOUNTER — Telehealth: Payer: Self-pay | Admitting: Internal Medicine

## 2023-08-03 DIAGNOSIS — I10 Essential (primary) hypertension: Secondary | ICD-10-CM | POA: Diagnosis not present

## 2023-08-03 DIAGNOSIS — R21 Rash and other nonspecific skin eruption: Secondary | ICD-10-CM | POA: Diagnosis not present

## 2023-08-03 DIAGNOSIS — K220 Achalasia of cardia: Secondary | ICD-10-CM | POA: Diagnosis not present

## 2023-08-03 NOTE — Telephone Encounter (Signed)
 Patient called and stated that he needs to talk to the nurse regarding his condition. Patient stated that he is requesting a call back. Please advise.

## 2023-08-03 NOTE — Telephone Encounter (Signed)
 Multiple encounters have been made on this pt. Refer to note 5/16.

## 2023-08-03 NOTE — Telephone Encounter (Signed)
 Spoke with patient regarding NP recommendations. Unfortunately, there are no sooner openings for the manometry. He has an appointment today with his PCP for the itchy rash that has returned on his neck, he believes it is shingles. Advised he reach out to [facility] & follow up on his referral & discuss with PCP further today regarding his gas pains.

## 2023-08-03 NOTE — Telephone Encounter (Signed)
 Inbound call from patient, requesting to speak to a nurse in regards to telephone notes from 5/14 5/16 and 5/15.

## 2023-08-03 NOTE — Telephone Encounter (Signed)
See mychart encounter from today.

## 2023-08-06 ENCOUNTER — Encounter: Payer: Self-pay | Admitting: Internal Medicine

## 2023-08-06 ENCOUNTER — Telehealth: Payer: Self-pay | Admitting: Internal Medicine

## 2023-08-06 NOTE — Telephone Encounter (Signed)
 Patient sent a MyChart message with same request. See 08/06/23 patient message.

## 2023-08-06 NOTE — Telephone Encounter (Signed)
 Patient called and stated that he was told he needed to reach out to us  regarding us  seeing if we can get him a sooner appointment in order for him to get esophogeal motility test done sooner. Patient Is requesting a call back. Please advise.

## 2023-08-07 ENCOUNTER — Ambulatory Visit: Payer: Self-pay | Admitting: Allergy and Immunology

## 2023-08-07 ENCOUNTER — Encounter: Payer: Self-pay | Admitting: Internal Medicine

## 2023-08-09 ENCOUNTER — Encounter (HOSPITAL_COMMUNITY): Payer: Self-pay

## 2023-08-09 ENCOUNTER — Ambulatory Visit (HOSPITAL_COMMUNITY): Admit: 2023-08-09 | Admitting: Internal Medicine

## 2023-08-09 SURGERY — EGD (ESOPHAGOGASTRODUODENOSCOPY)
Anesthesia: Monitor Anesthesia Care

## 2023-08-14 ENCOUNTER — Telehealth: Payer: Self-pay | Admitting: Internal Medicine

## 2023-08-14 NOTE — Telephone Encounter (Signed)
 PT is requesting to speak with a nurse regarding future plans on his care. He stated this is not an emergency but he would like someone to speak with him promptly

## 2023-08-16 NOTE — Telephone Encounter (Signed)
 Left message for patient to call back

## 2023-08-16 NOTE — Telephone Encounter (Signed)
 See phone note 5/27.

## 2023-08-16 NOTE — Telephone Encounter (Signed)
 Returned patient call & he wanted to know what procedures would be done if diagnosed with achalsia next Tuesday (after esophageal mano) and if our office did anything laparoscopically. Advised him that once results were back (which can take at least two weeks) that we'd be in touch with him regarding Dr. Les Rao recommendations on any future procedures and what that may entail & that we do not perform laparoscopic procedures. He had no further questions.

## 2023-08-17 DIAGNOSIS — F419 Anxiety disorder, unspecified: Secondary | ICD-10-CM | POA: Diagnosis not present

## 2023-08-17 DIAGNOSIS — N419 Inflammatory disease of prostate, unspecified: Secondary | ICD-10-CM | POA: Diagnosis not present

## 2023-08-17 DIAGNOSIS — K220 Achalasia of cardia: Secondary | ICD-10-CM | POA: Diagnosis not present

## 2023-08-21 ENCOUNTER — Encounter: Payer: Self-pay | Admitting: Internal Medicine

## 2023-08-22 ENCOUNTER — Encounter (HOSPITAL_COMMUNITY)
Admission: RE | Disposition: A | Discharge status: Home / Self Care | Payer: Self-pay | Source: Home / Self Care | Attending: Internal Medicine

## 2023-08-22 ENCOUNTER — Ambulatory Visit (HOSPITAL_COMMUNITY)
Admission: RE | Admit: 2023-08-22 | Discharge: 2023-08-22 | Disposition: A | Discharge status: Home / Self Care | Attending: Internal Medicine | Admitting: Internal Medicine

## 2023-08-22 ENCOUNTER — Encounter (HOSPITAL_COMMUNITY): Payer: Self-pay | Admitting: Internal Medicine

## 2023-08-22 DIAGNOSIS — R1310 Dysphagia, unspecified: Secondary | ICD-10-CM | POA: Diagnosis not present

## 2023-08-22 HISTORY — PX: ESOPHAGEAL MANOMETRY: SHX5429

## 2023-08-22 SURGERY — MANOMETRY, ESOPHAGUS

## 2023-08-22 MED ORDER — LIDOCAINE VISCOUS HCL 2 % MT SOLN
OROMUCOSAL | Status: AC
  Filled 2023-08-22: qty 15

## 2023-08-22 SURGICAL SUPPLY — 2 items
FACESHIELD LNG OPTICON STERILE (SAFETY) IMPLANT
GLOVE BIO SURGEON STRL SZ8 (GLOVE) ×2 IMPLANT

## 2023-08-22 NOTE — Telephone Encounter (Signed)
 Called and spoke with patient. Patient wanted to make sure that he did not have to hold Protonix , I advised that he did not have to hold medication as this is only the manometry, no pH study. Patient wanted to make sure Botox  would not affect the procedure today. Patient also states that he did not know if he could take his BP medicine and now it is too late. Patient reports that his BP was 147/98. Patient states that he was going to try and relax before the manometry. Patient had no other concerns at the end of the call.

## 2023-08-22 NOTE — Progress Notes (Signed)
 Esophageal manometry performed per protocol.  Patient tolerated well.

## 2023-08-22 NOTE — Telephone Encounter (Signed)
 Inbound call from patient requesting a call to discuss questions he has regarding today's procedure. Requesting a call back. Please advise, thank you.

## 2023-08-23 ENCOUNTER — Encounter (HOSPITAL_COMMUNITY): Payer: Self-pay | Admitting: Internal Medicine

## 2023-08-27 ENCOUNTER — Ambulatory Visit: Payer: Self-pay | Admitting: Internal Medicine

## 2023-08-27 NOTE — Progress Notes (Signed)
 Spoke to the patient about the results of his esophageal manometry, which were normal.  He has normal esophageal motility as well as intact augmentation of smooth muscle contraction.  Patient was relieved to hear of this result.  He has been able to eat foods normally and has tried to be careful with chewing his food carefully.  Will plan to follow-up with him in July to check in and see how he is doing at that time.  He would like to talk about getting a surveillance colonoscopy scheduled in the future. He is due for a colonoscopy in 02/2024. Patient inquired about getting an upper endoscopy scheduled at that time as well.

## 2023-08-29 ENCOUNTER — Ambulatory Visit: Admitting: Internal Medicine

## 2023-08-30 DIAGNOSIS — L249 Irritant contact dermatitis, unspecified cause: Secondary | ICD-10-CM | POA: Diagnosis not present

## 2023-09-13 DIAGNOSIS — S50862A Insect bite (nonvenomous) of left forearm, initial encounter: Secondary | ICD-10-CM | POA: Diagnosis not present

## 2023-09-13 DIAGNOSIS — L538 Other specified erythematous conditions: Secondary | ICD-10-CM | POA: Diagnosis not present

## 2023-09-13 DIAGNOSIS — L820 Inflamed seborrheic keratosis: Secondary | ICD-10-CM | POA: Diagnosis not present

## 2023-09-20 DIAGNOSIS — J342 Deviated nasal septum: Secondary | ICD-10-CM | POA: Diagnosis not present

## 2023-09-20 DIAGNOSIS — J309 Allergic rhinitis, unspecified: Secondary | ICD-10-CM | POA: Diagnosis not present

## 2023-09-20 DIAGNOSIS — F419 Anxiety disorder, unspecified: Secondary | ICD-10-CM | POA: Diagnosis not present

## 2023-09-20 DIAGNOSIS — J343 Hypertrophy of nasal turbinates: Secondary | ICD-10-CM | POA: Diagnosis not present

## 2023-09-20 DIAGNOSIS — F331 Major depressive disorder, recurrent, moderate: Secondary | ICD-10-CM | POA: Diagnosis not present

## 2023-09-20 DIAGNOSIS — E782 Mixed hyperlipidemia: Secondary | ICD-10-CM | POA: Diagnosis not present

## 2023-09-24 NOTE — Telephone Encounter (Signed)
 Pt requesting a sooner appt due to worsening symptoms(pt offered tomorrow at 9 am and is unable to come in until next week,no sooner openings showing before 7/22).Please call him at [phone].

## 2023-09-27 ENCOUNTER — Encounter: Payer: Self-pay | Admitting: Internal Medicine

## 2023-09-27 ENCOUNTER — Other Ambulatory Visit: Payer: Self-pay

## 2023-09-27 MED ORDER — PANTOPRAZOLE SODIUM 40 MG PO TBEC: 40.0000 mg | DELAYED_RELEASE_TABLET | Freq: Every day | ORAL | 1 refills | Status: DC

## 2023-10-01 DIAGNOSIS — J342 Deviated nasal septum: Secondary | ICD-10-CM | POA: Diagnosis not present

## 2023-10-01 DIAGNOSIS — J343 Hypertrophy of nasal turbinates: Secondary | ICD-10-CM | POA: Diagnosis not present

## 2023-10-01 DIAGNOSIS — T485X5A Adverse effect of other anti-common-cold drugs, initial encounter: Secondary | ICD-10-CM | POA: Diagnosis not present

## 2023-10-01 DIAGNOSIS — F419 Anxiety disorder, unspecified: Secondary | ICD-10-CM | POA: Diagnosis not present

## 2023-10-01 DIAGNOSIS — J310 Chronic rhinitis: Secondary | ICD-10-CM | POA: Diagnosis not present

## 2023-10-03 DIAGNOSIS — J329 Chronic sinusitis, unspecified: Secondary | ICD-10-CM | POA: Diagnosis not present

## 2023-10-03 DIAGNOSIS — J301 Allergic rhinitis due to pollen: Secondary | ICD-10-CM | POA: Diagnosis not present

## 2023-10-03 DIAGNOSIS — J342 Deviated nasal septum: Secondary | ICD-10-CM | POA: Diagnosis not present

## 2023-10-03 DIAGNOSIS — J3489 Other specified disorders of nose and nasal sinuses: Secondary | ICD-10-CM | POA: Diagnosis not present

## 2023-10-11 ENCOUNTER — Ambulatory Visit: Admitting: Internal Medicine

## 2023-10-11 ENCOUNTER — Other Ambulatory Visit: Payer: Self-pay | Admitting: Internal Medicine

## 2023-10-11 ENCOUNTER — Encounter: Payer: Self-pay | Admitting: Internal Medicine

## 2023-10-11 VITALS — BP 124/68 | HR 70 | Ht 67.0 in | Wt 177.0 lb

## 2023-10-11 DIAGNOSIS — K2270 Barrett's esophagus without dysplasia: Secondary | ICD-10-CM

## 2023-10-11 DIAGNOSIS — K219 Gastro-esophageal reflux disease without esophagitis: Secondary | ICD-10-CM | POA: Diagnosis not present

## 2023-10-11 DIAGNOSIS — Z860100 Personal history of colon polyps, unspecified: Secondary | ICD-10-CM | POA: Diagnosis not present

## 2023-10-11 DIAGNOSIS — R1310 Dysphagia, unspecified: Secondary | ICD-10-CM

## 2023-10-11 MED ORDER — PANTOPRAZOLE SODIUM 20 MG PO TBEC: 40.0000 mg | DELAYED_RELEASE_TABLET | Freq: Every day | ORAL | 1 refills | Status: DC

## 2023-10-11 NOTE — Patient Instructions (Signed)
 We have sent the following medications to your pharmacy for you to pick up at your convenience: Pantoprazole   _______________________________________________________  If your blood pressure at your visit was 140/90 or greater, please contact your primary care physician to follow up on this.  _______________________________________________________  If you are age 72 or older, your body mass index should be between 23-30. Your Body mass index is 27.72 kg/m. If this is out of the aforementioned range listed, please consider follow up with your Primary Care Provider.  If you are age 82 or younger, your body mass index should be between 19-25. Your Body mass index is 27.72 kg/m. If this is out of the aformentioned range listed, please consider follow up with your Primary Care Provider.   ________________________________________________________  The [facility] providers would like to encourage you to use MYCHART to communicate with providers for non-urgent requests or questions.  Due to long hold times on the telephone, sending your provider a message by [facility] may be a faster and more efficient way to get a response.  Please allow 48 business hours for a response.  Please remember that this is for non-urgent requests.  _______________________________________________________  [facility] is using a team-based approach to care.  Your team is made up of your doctor and two to three APPS. Our APPS (Nurse Practitioners and Physician Assistants) work with your physician to ensure care continuity for you. They are fully qualified to address your health concerns and develop a treatment plan. They communicate directly with your gastroenterologist to care for you. Seeing the Advanced Practice Practitioners on your physician's team can help you by facilitating care more promptly, often allowing for earlier appointments, access to diagnostic testing, procedures, and other specialty referrals.    Thank you for entrusting me with your care and for choosing [facility], Dr. Estefana Kidney

## 2023-10-11 NOTE — Progress Notes (Unsigned)
 Chief Complaint:follow-up dysphagia Primary GI Doctor:Dr. Federico  HPI: 73 year old male with history of osteoarthritis, obesity, OSA, and GERD presents for follow up of dysphagia.   3/2/0/25 GI visit with Dr. Federico patient had improvement in his dysphagia after esophageal dilation in 12/2022, but unfortunately, he has had a recurrence in his dysphagia early March.He is currently restricting his diet due to his issues with dysphagia. He also still has some uncontrolled reflux but cannot tolerate taking higher dose of his PPI therapy. Thus will trial him on Voquezna  if we are able to get this approved with his insurance. Will plan for another EGD for repeat dilation, to look for any additional stomach polyps, and to further evaluate for Barrett's esophagus. Since patient's symptoms have recurred relatively quickly, will also plan for barium swallow and esophageal manometry. I do wonder if anxiety may also be significantly contributing to his symptoms   Interval History: He is eating everything that he wants to eat currently. He is gaining back the weight loss. Gas no longer builds up in his esophagus. He is taking pantoprazole  40 mg daily.   Wt Readings from Last 3 Encounters:  10/11/23 177 lb (80.3 kg)  07/29/23 173 lb (78.5 kg)  06/28/23 186 lb (84.4 kg)         Patient presents for follow-up after EGD and barium esophagram with several questions. Patient has history of GERD complicated with Barretts and currently taking Pantoprazole  40 mg po daily. Patient was recently started on Voquezna  10 mg po daily at last appointment, but states he felt like it gave him heart palpitations so he discontinued it after a few days and went back to Pantoprazole  40 mg po daily.       Patient states the baclofen  he was started on has caused constipation and urinary frequency. He is currently taking Doculax to go more regularly. He is only taking the baclofen  once daily due to concerns with increased side  effects taking more frequentyl. He cannot recall if it helps much. He does feel the dilatation may have helped some, but it still feels like solids sit in his chest and slowly move into his stomach. He is scheduled on 10/24/23 for esophageal manometry, he is very anxious today about pursuing other alternative treatment options. His diet consists of soup and oatmeal. He is afraid to eat regular food out of fear of choking. He expresses concerns with weight loss and waiting for manometry test. He states he is feeling depressed and overall his desire to eat has decreased. His PCP had offered medication but he had concerns about it interacting with his other medications so he did not take it. His wife at bedside states his anxiety has increased significantly over the last few months.  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   Hyperlipidemia    Hypertension    Seasonal allergies     Past Surgical History:  Procedure Laterality Date   BALLOON DILATION N/A 07/30/2023   Procedure: BALLOON DILATION;  Surgeon: Leigh Elspeth SQUIBB, MD;  Location: [facility] ENDOSCOPY;  Service: Gastroenterology;  Laterality: N/A;   BOTOX  INJECTION  07/30/2023  Procedure: BOTOX  INJECTION;  Surgeon: Leigh Elspeth SQUIBB, MD;  Location: [facility] ENDOSCOPY;  Service: Gastroenterology;;   COLONOSCOPY WITH PROPOFOL  N/A 11/09/2014   Procedure: COLONOSCOPY WITH PROPOFOL ;  Surgeon: Gladis MARLA Louder, MD;  Location: [facility] ENDOSCOPY;  Service: Endoscopy;  Laterality: N/A;   ESOPHAGEAL MANOMETRY N/A 08/22/2023   Procedure: MANOMETRY, ESOPHAGUS;  Surgeon: Federico Rosario BROCKS, MD;  Location: [facility] ENDOSCOPY;  Service: Gastroenterology;  Laterality: N/A;   ESOPHAGOGASTRODUODENOSCOPY N/A 07/30/2023    Procedure: EGD (ESOPHAGOGASTRODUODENOSCOPY);  Surgeon: Leigh Elspeth SQUIBB, MD;  Location: [facility] ENDOSCOPY;  Service: Gastroenterology;  Laterality: N/A;  with possible dilation and also Botox    HERNIA REPAIR  03/20/1970   inguinal   LYMPH NODE BIOPSY  03/21/1971   benign    Current Outpatient Medications  Medication Sig Dispense Refill   cholecalciferol (VITAMIN D3) 25 MCG (1000 UNIT) tablet 1 tablet Orally Once a day     losartan-hydrochlorothiazide (HYZAAR) 100-12.5 MG tablet Take 1 tablet by mouth daily.     mometasone (NASONEX) 50 MCG/ACT nasal spray Place 2 sprays into the nose daily.     pantoprazole  (PROTONIX ) 40 MG tablet Take 1 tablet (40 mg total) by mouth daily. 90 tablet 1   No current facility-administered medications for this visit.    Allergies as of 10/11/2023 - Review Complete 10/11/2023  Allergen Reaction Noted   Coreg [carvedilol] Other (See Comments) 07/29/2023   Niacin  07/30/2023   Ampicillin Rash 11/09/2014   Codeine Nausea Only 10/21/2014   Demerol [meperidine] Nausea Only 11/09/2014   Doxycycline Rash 11/09/2014   Metoprolol Palpitations 07/29/2023   Penicillins Rash 11/09/2014    Family History  Problem Relation Age of Onset   Breast cancer Mother    Colon polyps Mother    Heart disease Mother    Congestive Heart Failure Mother    Lung cancer Father        smoker but had quit years before   Colon cancer Neg Hx    Esophageal cancer Neg Hx    Rectal cancer Neg Hx    Stomach cancer Neg Hx     Review of Systems:    Constitutional: No weight loss, fever, chills, weakness or fatigue HEENT: Eyes: No change in vision               Ears, Nose, Throat:  No change in hearing or congestion Skin: No rash or itching Cardiovascular: No chest pain, chest pressure or palpitations   Respiratory: No SOB or cough Gastrointestinal: See HPI and otherwise negative Genitourinary: No dysuria or change in urinary frequency Neurological: No headache, dizziness or  syncope Musculoskeletal: No new muscle or joint pain Hematologic: No bleeding or bruising Psychiatric: No history of depression or anxiety    Physical Exam:  Vital signs: BP 124/68   Pulse 70   Ht 5' 7 (1.702 m)   Wt 177 lb (80.3 kg)   BMI 27.72 kg/m   Constitutional:   Pleasant male appears to be in NAD, Well developed, Well nourished, alert and cooperative Throat: Oral cavity and pharynx without inflammation, swelling or lesion.  Respiratory: Respirations even and unlabored. Lungs clear to auscultation bilaterally.   No wheezes, crackles, or rhonchi.  Cardiovascular: Normal S1, S2. Regular rate and rhythm. No peripheral edema, cyanosis or pallor.  Gastrointestinal:  Soft, nondistended, nontender. No rebound or guarding. Normal bowel sounds. No appreciable masses or hepatomegaly. Rectal:  Not performed.  Msk:  Symmetrical without gross deformities. Without edema, no deformity or joint abnormality.  Neurologic:  Alert  and  oriented x4;  grossly normal neurologically.  Skin:   Dry and intact without significant lesions or rashes. Psychiatric: Oriented to person, place and time. Demonstrates good judgement and reason without abnormal affect or behaviors.  RELEVANT LABS AND IMAGING: In 2005, colonoscopy performed with removal of a 2 mm adenomatous cecal polyp    10/22/2008 normal surveillance colonoscopy performed.    Had a normal colonoscopy in 2016   Colonoscopy 01/06/22: 8 4-6 colon polyps that were removed. Diverticulosis in the sigmoid, descending, transverse colon, and ascenidng colon. Repeat colonoscopy recommended in 3 years.   EGD 01/17/23: - One benign- appearing, intrinsic moderate ( circumferential scarring or stenosis; an endoscope may pass) stenosis was found at the gastroesophageal junction. This stenosis measured less than one cm ( in length) . The stenosis was traversed. A TTS dilator was passed through the scope. Dilation with an 18- 19- 20 mm balloon dilator was  performed to 20 mm. - Biopsies were taken with a cold forceps in the proximal esophagus and in the distal esophagus for histology. - 12 5 to 12 mm sessile polyps with no bleeding and no stigmata of recent bleeding were found in the gastric body. These polyps were removed with a cold snare. Resection and retrieval were complete. - Localized inflammation characterized by congestion ( edema) and erythema was found in the gastric antrum. Biopsies were taken with a cold forceps for histology. - A single 8 mm sessile polyp with no bleeding was found in the ampulla. Biopsies were taken with a cold forceps for histology. Path:  1. Surgical [P], ampulla polyp :      -  SMALL INTESTINAL MUCOSA WITH PROMINENT FOVEOLAR METAPLASIA AND      EROSION/ULCERATION SUGGESTIVE OF CHRONIC PEPTIC DUODENITIS, CLINICAL/ENDOSCOPIC      CORRELATION RECOMMENDED.      2. Surgical [P], gastric :      -  ANTRAL AND OXYNTIC MUCOSA WITH MILD CHEMICAL/REACTIVE CHANGE.      -  NO HELICOBACTER PYLORI ORGANISMS IDENTIFIED ON H&E STAINED SLIDE.      3. Surgical [P], gastric polyps, polyp (12) :      -  MIXED FUNDIC GLAND AND HYPERPLASTIC POLYPS (PREDOMINANTLY FUNDIC GLAND) WITH      EVIDENCE OF SUBMUCOSAL INVOLVEMENT (I.E.GASTRITIS CYSTICA POLYPOSA).      -  NEGATIVE FOR DYSPLASIA.      4. Surgical [P], esophagus :      -  SQUAMOCOLUMNAR JUNCTIONAL MUCOSA WITH FOCAL INTESTINAL METAPLASIA CONSISTENT      WITH BARRETT'S ESOPHAGUS IN THE APPROPRIATE ENDOSCOPIC SETTING.      -  NEGATIVE FOR DYSPLASIA.   06/19/23 EGD Impression:  - Benign- appearing esophageal stenosis. Dilated. Biopsied.  - Two gastric polyps. Resected and retrieved. - Normal examined duodenum. Path: FINAL DIAGNOSIS        1. Surgical [P], GE junction :       GASTROESOPHAGEAL MUCOSA WITH INTESTINAL METAPLASIA CONSISTENT WITH BARRETT'S       ESOPHAGUS.       NEGATIVE FOR DYSPLASIA.        2. Surgical [P], gastric polyp, polyp (2) :       FUNDIC GLAND POLYPS (2).        NEGATIVE FOR DYSPLASIA AND MALIGNANCY.    06/21/23 ESOPHAGUS/BARIUM SWALLOW/TABLET STUDY  IMPRESSION: Esophagram performed with evidence of esophageal dysmotility and subtle smooth walled narrowing at the GE junction.  Assessment: Dysphagia GERD Barrett's esophagus History of colon polyps      Patient had  improvement in his dysphagia after esophageal dilation in 12/2022, but unfortunately, he has had a recurrence in his dysphagia. He is currently restricting his diet due to his issues with dysphagia. He also still has some uncontrolled reflux but cannot tolerate taking higher dose of his PPI therapy. He states he had SE's and stopped the Voquenza. He asks for other options. He will continue the pantoprazole  for now. Patient has not had complete resolution of dysphagia with recent dilatation. His Barium esophagram barium swallow study showed some subtle narrowing at the junction between the esophagus and the stomach as well as some evidence of a potential motility disorder. Pt started on baclofen  5mg  which he is only taking once daily due to constipation and urinary frequency he states it causes. We made informed decision today to discontinue it. He would like to proceed with another intervention. Discussed with Dr. Federico and we can go ahead and proceed with Botox  injection of the GE junction at the hospital. He will proceed with esophageal manometry as scheduled in August as well. I recommended he discuss with his PCP about medications for anxiety and depression as it is greatly impacting his overall quality of life.  Plan: -Reduce pantoprazole  from 40 mg to 20 mg daily -Next colonoscopy due in 2026 for colon cancer screening. Recall in 12/2024. - Next EGD due in 06/2028 for Barrett' surveillance  Estefana Federico, MD [facility] Gastroenterology 10/11/2023, 10:58 AM  Cc: Charlott Dorn LABOR, *

## 2023-10-16 ENCOUNTER — Ambulatory Visit: Payer: Self-pay | Admitting: Internal Medicine

## 2023-10-19 DIAGNOSIS — J342 Deviated nasal septum: Secondary | ICD-10-CM | POA: Diagnosis not present

## 2023-10-19 DIAGNOSIS — J3489 Other specified disorders of nose and nasal sinuses: Secondary | ICD-10-CM | POA: Diagnosis not present

## 2023-10-19 DIAGNOSIS — J329 Chronic sinusitis, unspecified: Secondary | ICD-10-CM | POA: Diagnosis not present

## 2023-11-16 DIAGNOSIS — Z1331 Encounter for screening for depression: Secondary | ICD-10-CM | POA: Diagnosis not present

## 2023-11-16 DIAGNOSIS — Z23 Encounter for immunization: Secondary | ICD-10-CM | POA: Diagnosis not present

## 2023-11-16 DIAGNOSIS — Z0000 Encounter for general adult medical examination without abnormal findings: Secondary | ICD-10-CM | POA: Diagnosis not present

## 2023-11-22 DIAGNOSIS — H524 Presbyopia: Secondary | ICD-10-CM | POA: Diagnosis not present

## 2023-11-22 DIAGNOSIS — H5213 Myopia, bilateral: Secondary | ICD-10-CM | POA: Diagnosis not present

## 2023-11-22 DIAGNOSIS — H43813 Vitreous degeneration, bilateral: Secondary | ICD-10-CM | POA: Diagnosis not present

## 2023-11-22 DIAGNOSIS — H40013 Open angle with borderline findings, low risk, bilateral: Secondary | ICD-10-CM | POA: Diagnosis not present

## 2023-11-22 DIAGNOSIS — H52223 Regular astigmatism, bilateral: Secondary | ICD-10-CM | POA: Diagnosis not present

## 2023-11-22 DIAGNOSIS — H2513 Age-related nuclear cataract, bilateral: Secondary | ICD-10-CM | POA: Diagnosis not present

## 2023-11-27 DIAGNOSIS — Z85820 Personal history of malignant melanoma of skin: Secondary | ICD-10-CM | POA: Diagnosis not present

## 2023-11-27 DIAGNOSIS — Z08 Encounter for follow-up examination after completed treatment for malignant neoplasm: Secondary | ICD-10-CM | POA: Diagnosis not present

## 2023-11-27 DIAGNOSIS — L814 Other melanin hyperpigmentation: Secondary | ICD-10-CM | POA: Diagnosis not present

## 2023-11-27 DIAGNOSIS — Z85828 Personal history of other malignant neoplasm of skin: Secondary | ICD-10-CM | POA: Diagnosis not present

## 2023-11-27 DIAGNOSIS — D2361 Other benign neoplasm of skin of right upper limb, including shoulder: Secondary | ICD-10-CM | POA: Diagnosis not present

## 2023-11-27 DIAGNOSIS — L570 Actinic keratosis: Secondary | ICD-10-CM | POA: Diagnosis not present

## 2023-11-27 DIAGNOSIS — L111 Transient acantholytic dermatosis [Grover]: Secondary | ICD-10-CM | POA: Diagnosis not present

## 2023-11-27 DIAGNOSIS — B351 Tinea unguium: Secondary | ICD-10-CM | POA: Diagnosis not present

## 2023-11-27 DIAGNOSIS — L821 Other seborrheic keratosis: Secondary | ICD-10-CM | POA: Diagnosis not present

## 2024-01-09 DIAGNOSIS — E7800 Pure hypercholesterolemia, unspecified: Secondary | ICD-10-CM | POA: Diagnosis not present

## 2024-01-14 NOTE — Progress Notes (Unsigned)
 01/15/2024 Frederick Weaver [id number] 1950-08-24  Referring provider: Charlott Dorn LABOR, * Primary GI doctor: Dr. Federico  ASSESSMENT AND PLAN:  Dysphagia with GERD/Barrett's esophagus 01/17/2023 EGD stenosis status post dilatation chronic peptic duodenitis negative H. pylori HP polyps Barrett's esophagus negative dysplasia 06/19/2023 EGD stenosis status post dilation intestinal metaplasia consistent with Barrett's esophagus no dysplasia 06/21/2023 barium swallow esophageal dysmotility subtle smooth wall narrowing GE junction 07/30/2023 mild stenosis GJ status post dilatation 20 mm injected Botox  08/27/2023 esophageal manometry normal esophageal motility study Unable to tolerate Voquezna , switched to pantoprazole  40 mg daily, but has been on 20 mg pantoprazole  daily with OTC acid medication that is helping -alginate therapy given -Lifestyle changes discussed, avoid NSAIDS, ETOH, hand out given to the patient -Weight loss discussed with the patient -will due OV prior to discuss need for EGD with colonoscopy  Constipation/abdominal pain 07/24/2023 CTAP W for abdominal pain no evidence of food bolus diverticulosis without diverticulitis fecal material noted throughout the colon normal liver, gallbladder, pancreas and spleen - Increase fiber/ water intake, decrease caffeine, increase activity level.  Personal history of colon polyps Colonoscopy 01/06/22: 8 4-6 mm colon polyps that were removed. Diverticulosis in the sigmoid, descending, transverse colon, and ascending colon. Repeat colonoscopy recommended in 3 years  Due 12/2024- will due OV prior to discuss need for EGD with colonoscopy  Diverticulosis Will call if any symptoms. Add on fiber supplement, avoid NSAIDS, information given   Patient Care Team: Charlott Dorn LABOR, MD as PCP - General (Internal Medicine)  HISTORY OF PRESENT ILLNESS: 73 y.o. male with a past medical history listed below presents for evaluation of GERD.    Patient last seen in the office 10/11/2023 by Dr. Federico for dysphagia.  Unable to tolerate Voquenza now secondary to heart palpitations.  Discussed the use of AI scribe software for clinical note transcription with the patient, who gave verbal consent to proceed.  History of Present Illness   Frederick Weaver. Sharolyn is a 73 year old male with Barrett's esophagus who presents for follow-up regarding gastrointestinal issues.  He initially experienced swallowing difficulties in June of the previous year, with food getting stuck in his throat, leading to an endoscopy. Although he initially improved, he later developed gas in his esophagus, which prevented him from eating and resulted in a 35-pound weight loss. He underwent another endoscopy and an esophageal motility test in June, which was normal. Since then, he has been eating well without any swallowing issues.  He has a history of low-grade Barrett's esophagus and is due for a colonoscopy and endoscopy. He was previously on 40 mg of pantoprazole  but has reduced the dose to 20 mg. He feels that the lower dose is not always sufficient and has tried additional supplements to manage his symptoms. He takes the pantoprazole  in the morning and a supplement at night, which he believes helps with his condition.  He has experienced regular bowel movements since his last endoscopy, with no issues of constipation or diarrhea. He reports occasional gas but no recent episodes of diverticulitis, which he has had in the past.  He had a prostate issue that was resolved with a four-week course of doxycycline, and he has not had any further problems since then. He also had a breathing issue due to a deviated septum and turbinates, which was resolved with antibiotics, avoiding the need for surgery.  He takes fiber supplements regularly and has been managing his gastrointestinal symptoms with dietary adjustments. No  alcohol or smoking use. No nausea, vomiting,  heartburn, trouble swallowing, cough, throat clearing, hoarseness, and headaches. Occasional heartburn or acid reflux at night, which is managed with supplements.        He  reports that he has never smoked. He has never used smokeless tobacco. He reports that he does not drink alcohol and does not use drugs.  RELEVANT GI HISTORY, IMAGING AND LABS: Results   RADIOLOGY CT scan abdomen: Diverticulosis, no inflammation, stool throughout colon, normal liver, gallbladder, pancreas, spleen (07/2023)  DIAGNOSTIC Esophageal motility study: Normal (08/2022)     In 2005, colonoscopy performed with removal of a 2 mm adenomatous cecal polyp    10/22/2008 normal surveillance colonoscopy performed.    Had a normal colonoscopy in 2016   Colonoscopy 01/06/22: 8 4-6 colon polyps that were removed. Diverticulosis in the sigmoid, descending, transverse colon, and ascenidng colon. Repeat colonoscopy recommended in 3 years.   EGD 01/17/23: - One benign- appearing, intrinsic moderate ( circumferential scarring or stenosis; an endoscope may pass) stenosis was found at the gastroesophageal junction. This stenosis measured less than one cm ( in length) . The stenosis was traversed. A TTS dilator was passed through the scope. Dilation with an 18- 19- 20 mm balloon dilator was performed to 20 mm. - Biopsies were taken with a cold forceps in the proximal esophagus and in the distal esophagus for histology. - 12 5 to 12 mm sessile polyps with no bleeding and no stigmata of recent bleeding were found in the gastric body. These polyps were removed with a cold snare. Resection and retrieval were complete. - Localized inflammation characterized by congestion ( edema) and erythema was found in the gastric antrum. Biopsies were taken with a cold forceps for histology. - A single 8 mm sessile polyp with no bleeding was found in the ampulla. Biopsies were taken with a cold forceps for histology. Path:  1. Surgical [P], ampulla  polyp :      -  SMALL INTESTINAL MUCOSA WITH PROMINENT FOVEOLAR METAPLASIA AND      EROSION/ULCERATION SUGGESTIVE OF CHRONIC PEPTIC DUODENITIS, CLINICAL/ENDOSCOPIC      CORRELATION RECOMMENDED.      2. Surgical [P], gastric :      -  ANTRAL AND OXYNTIC MUCOSA WITH MILD CHEMICAL/REACTIVE CHANGE.      -  NO HELICOBACTER PYLORI ORGANISMS IDENTIFIED ON H&E STAINED SLIDE.      3. Surgical [P], gastric polyps, polyp (12) :      -  MIXED FUNDIC GLAND AND HYPERPLASTIC POLYPS (PREDOMINANTLY FUNDIC GLAND) WITH      EVIDENCE OF SUBMUCOSAL INVOLVEMENT (I.E.GASTRITIS CYSTICA POLYPOSA).      -  NEGATIVE FOR DYSPLASIA.      4. Surgical [P], esophagus :      -  SQUAMOCOLUMNAR JUNCTIONAL MUCOSA WITH FOCAL INTESTINAL METAPLASIA CONSISTENT WITH BARRETT'S ESOPHAGUS IN THE APPROPRIATE ENDOSCOPIC SETTING.      -  NEGATIVE FOR DYSPLASIA.    06/19/23 EGD Impression:  - Benign- appearing esophageal stenosis. Dilated. Biopsied.  - Two gastric polyps. Resected and retrieved. - Normal examined duodenum. Path: FINAL DIAGNOSIS       1. Surgical [P], GE junction :       GASTROESOPHAGEAL MUCOSA WITH INTESTINAL METAPLASIA CONSISTENT WITH BARRETT'S       ESOPHAGUS.       NEGATIVE FOR DYSPLASIA.      2. Surgical [P], gastric polyp, polyp (2) :       FUNDIC GLAND POLYPS (2).  NEGATIVE FOR DYSPLASIA AND MALIGNANCY.    06/21/23 ESOPHAGUS/BARIUM SWALLOW/TABLET STUDY  IMPRESSION: Esophagram performed with evidence of esophageal dysmotility and subtle smooth walled narrowing at the GE junction.   EGD 07/30/23: The Z- line was regular. One benign- appearing, intrinsic mild stenosis was found at the GEJ This stenosis measured less than one cm ( in length) . A TTS dilator was passed through the scope. Dilation with an 18- 19- 20 mm balloon dilator was performed to 18 mm, 19 mm and 20 mm. Very mild mucosal dilation effect noted only at 20mm balloon dilation. Following dilation, area 1cm above the SCJ was successfully  injected with 100 units botulinum toxin ( 4 injection of 25 units / 1 ml each) . The exam of the esophagus was otherwise normal. The entire examined stomach was normal. The examined duodenum was normal.   Esophageal manometry 08/22/23:    CBC    Component Value Date/Time   WBC 7.7 07/30/2023 0714   RBC 5.04 07/30/2023 0714   HGB 14.4 07/30/2023 0714   HCT 41.4 07/30/2023 0714   PLT 277 07/30/2023 0714   MCV 82.1 07/30/2023 0714   MCH 28.6 07/30/2023 0714   MCHC 34.8 07/30/2023 0714   RDW 13.0 07/30/2023 0714   LYMPHSABS 1.9 07/24/2023 1855   MONOABS 1.0 07/24/2023 1855   EOSABS 0.2 07/24/2023 1855   BASOSABS 0.1 07/24/2023 1855   Recent Labs    07/24/23 1855 07/29/23 1505 07/30/23 0714  HGB 14.5 14.0 14.4    CMP     Component Value Date/Time   NA 136 07/30/2023 0714   K 3.5 07/30/2023 0714   CL 103 07/30/2023 0714   CO2 21 (L) 07/30/2023 0714   GLUCOSE 95 07/30/2023 0714   BUN 10 07/30/2023 0714   CREATININE 0.83 07/30/2023 0714   CALCIUM  9.4 07/30/2023 0714   PROT 6.6 07/30/2023 0714   ALBUMIN 3.7 07/30/2023 0714   AST 19 07/30/2023 0714   ALT 24 07/30/2023 0714   ALKPHOS 70 07/30/2023 0714   BILITOT 0.9 07/30/2023 0714   GFRNONAA >60 07/30/2023 0714      Latest Ref Rng & Units 07/30/2023    7:14 AM 07/29/2023    3:05 PM 07/24/2023    6:55 PM  Hepatic Function  Total Protein 6.5 - 8.1 g/dL 6.6  6.8  7.5   Albumin 3.5 - 5.0 g/dL 3.7  3.8  4.3   AST 15 - 41 U/L 19  18  19    ALT 0 - 44 U/L 24  26  32   Alk Phosphatase 38 - 126 U/L 70  73  79   Total Bilirubin 0.0 - 1.2 mg/dL 0.9  0.9  0.8       Current Medications:    Current Outpatient Medications (Cardiovascular):    losartan-hydrochlorothiazide (HYZAAR) 100-12.5 MG tablet, Take 1 tablet by mouth daily.  Current Outpatient Medications (Respiratory):    mometasone (NASONEX) 50 MCG/ACT nasal spray, Place 2 sprays into the nose daily.    Current Outpatient Medications (Other):    cholecalciferol  (VITAMIN D3) 25 MCG (1000 UNIT) tablet, 1 tablet Orally Once a day   pantoprazole  (PROTONIX ) 20 MG tablet, Take 1 tablet (20 mg total) by mouth daily.  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  Hyperlipidemia    Hypertension    Seasonal allergies    Allergies:  Allergies  Allergen Reactions   Coreg [Carvedilol] Other (See Comments)    CONGRESTION   Niacin     Becomes flushed    Ampicillin Rash   Codeine Nausea Only   Demerol [Meperidine] Nausea Only   Doxycycline Rash   Metoprolol Palpitations   Penicillins Rash     Surgical History:  He  has a past surgical history that includes Hernia repair (03/20/1970); Lymph node biopsy (03/21/1971); Colonoscopy with propofol  (N/A, 11/09/2014); Esophagogastroduodenoscopy (N/A, 07/30/2023); Balloon dilation (N/A, 07/30/2023); Botox  injection (07/30/2023); and Esophageal manometry (N/A, 08/22/2023). Family History:  His family history includes Breast cancer in his mother; Colon polyps in his mother; Congestive Heart Failure in his mother; Heart disease in his mother; Lung cancer in his father.  REVIEW OF SYSTEMS  : All other systems reviewed and negative except where noted in the History of Present Illness.  PHYSICAL EXAM: BP 134/88   Pulse 83   Ht 5' 8 (1.727 m)   Wt 195 lb 3.2 oz (88.5 kg)   BMI 29.68 kg/m  Physical Exam   GENERAL APPEARANCE: Well nourished, in no apparent distress, general appearance normal HEENT: No cervical lymphadenopathy, unremarkable thyroid , sclerae anicteric, conjunctiva pink RESPIRATORY: Respiratory effort normal, BS equal bilateral without rales, rhonchi, wheezing CARDIO: RRR with no MRGs, peripheral pulses intact ABDOMEN: Soft, non distended, active bowel sounds in all 4 quadrants, no tenderness to palpation, no rebound, no mass appreciated RECTAL:  declines MUSCULOSKELETAL: Full ROM, normal gait, without edema SKIN: Dry, intact without rashes or lesions. No jaundice. NEURO: Alert, oriented, no focal deficits PSYCH: Cooperative, normal mood and affect.      Alan JONELLE Coombs, PA-C 11:44 AM

## 2024-01-15 ENCOUNTER — Encounter: Payer: Self-pay | Admitting: Physician Assistant

## 2024-01-15 ENCOUNTER — Ambulatory Visit: Admitting: Physician Assistant

## 2024-01-15 VITALS — BP 134/88 | HR 83 | Ht 68.0 in | Wt 195.2 lb

## 2024-01-15 DIAGNOSIS — K2270 Barrett's esophagus without dysplasia: Secondary | ICD-10-CM

## 2024-01-15 DIAGNOSIS — R140 Abdominal distension (gaseous): Secondary | ICD-10-CM

## 2024-01-15 DIAGNOSIS — K5904 Chronic idiopathic constipation: Secondary | ICD-10-CM | POA: Diagnosis not present

## 2024-01-15 DIAGNOSIS — K219 Gastro-esophageal reflux disease without esophagitis: Secondary | ICD-10-CM

## 2024-01-15 DIAGNOSIS — Z860100 Personal history of colon polyps, unspecified: Secondary | ICD-10-CM | POA: Diagnosis not present

## 2024-01-15 DIAGNOSIS — R1310 Dysphagia, unspecified: Secondary | ICD-10-CM | POA: Diagnosis not present

## 2024-01-15 NOTE — Patient Instructions (Addendum)
 First do a trial off milk/lactose products if you use them.  Continue fiber Increase activity  Can do trial of IBGard which is over the counter for AB pain- Take 1-2 capsules once a day for maintence or twice a day during a flare For IBS and peppermint oil.  Peppermint oil has been proven to be better than placebo for cramping for IBS Stop if it worsens heart burn or causes flushing of your face.  Ideally enteric coated peppermint oil capsules over the counter IBGard, can take 2 a day is best but if you got the oil, you can use 0.38ml or 180 mg of pepperment oil up to 3 x a day.   Silent reflux: Not all heartburn burns...SABRASABRASABRA  Can continue the pantoprazole  20 mg 30 mins - 1 hour an hour before food Can take pepcid as needed at night  Check cardiac calcium  score  What is LPR? Laryngopharyngeal reflux (LPR) or silent reflux is a condition in which acid that is made in the stomach travels up the esophagus (swallowing tube) and gets to the throat. Not everyone with reflux has a lot of heartburn or indigestion. In fact, many people with LPR never have heartburn. This is why LPR is called SILENT REFLUX, and the terms Silent reflux and LPR are often used interchangeably. Because LPR is silent, it is sometimes difficult to diagnose.  How can you tell if you have LPR?  Chronic hoarseness- Some people have hoarseness that comes and goes throat clearing  Cough It can cause shortness of breath and cause asthma like symptoms. a feeling of a lump in the throat  difficulty swallowing a problem with too much nose and throat drainage.  Some people will feel their esophagus spasm which feels like their heart beating hard and fast, this will usually be after a meal, at rest, or lying down at night.    How do I treat this? Treatment for LPR should be individualized, and your doctor will suggest the best treatment for you. Generally there are several treatments for LPR: changing habits and diet to  reduce reflux,  medications to reduce stomach acid, and  surgery to prevent reflux. Most people with LPR need to modify how and when they eat, as well as take some medication, to get well. Sometimes, nonprescription liquid antacids, such as Maalox, Gelucil and Mylanta are recommended. When used, these antacids should be taken four times each day - one tablespoon one hour after each meal and before bedtime. Dietary and lifestyle changes alone are not often enough to control LPR - medications that reduce stomach acid are also usually needed. These must be prescribed by our doctor.   TIPS FOR REDUCING REFLUX AND LPR Control your LIFE-STYLE and your DIET! If you use tobacco, QUIT.  Smoking makes you reflux. After every cigarette you have some LPR.  Don't wear clothing that is too tight, especially around the waist (trousers, corsets, belts).  Do not lie down just after eating...in fact, do not eat within three hours of bedtime.  You should be on a low-fat diet.  Limit your intake of red meat.  Limit your intake of butter.  Avoid fried foods.  Avoid chocolate  Avoid cheese.  Avoid eggs. Specifically avoid caffeine (especially coffee and tea), soda pop (especially cola) and mints.  Avoid alcoholic beverages, particularly in the evening.  Diverticulosis Diverticulosis is a condition that develops when small pouches (diverticula) form in the wall of the large intestine (colon). The colon is where water  is absorbed and stool (feces) is formed. The pouches form when the inside layer of the colon pushes through weak spots in the outer layers of the colon. You may have a few pouches or many of them. The pouches usually do not cause problems unless they become inflamed or infected. When this happens, the condition is called diverticulitis- this is left lower quadrant pain, diarrhea, fever, chills, nausea or vomiting.  If this occurs please call the office or go to the hospital. Sometimes these  patches without inflammation can also have painless bleeding associated with them, if this happens please call the office or go to the hospital. Preventing constipation and increasing fiber can help reduce diverticula and prevent complications. Even if you feel you have a high-fiber diet, suggest getting on Benefiber or Cirtracel 2 times daily.   Toileting tips to help with your constipation - Drink at least 64-80 ounces of water/liquid per day. - Establish a time to try to move your bowels every day.  For many people, this is after a cup of coffee or after a meal such as breakfast. - Sit all of the way back on the toilet keeping your back fairly straight and while sitting up, try to rest the tops of your forearms on your upper thighs.   - Raising your feet with a step stool/squatty potty can be helpful to improve the angle that allows your stool to pass through the rectum. - Relax the rectum feeling it bulge toward the toilet water.  If you feel your rectum raising toward your body, you are contracting rather than relaxing. - Breathe in and slowly exhale. Belly breath by expanding your belly towards your belly button. Keep belly expanded as you gently direct pressure down and back to the anus.  A low pitched GRRR sound can assist with increasing intra-abdominal pressure.  (Can also trying to blow on a pinwheel and make it move, this helps with the same belly breathing) - Repeat 3-4 times. If unsuccessful, contract the pelvic floor to restore normal tone and get off the toilet.  Avoid excessive straining. - To reduce excessive wiping by teaching your anus to normally contract, place hands on outer aspect of knees and resist knee movement outward.  Hold 5-10 second then place hands just inside of knees and resist inward movement of knees.  Hold 5 seconds.  Repeat a few times each way.  Go to the ER if unable to pass gas, severe AB pain, unable to hold down food, any shortness of breath of chest  pain.     FODMAP stands for fermentable oligo-, di-, mono-saccharides and polyols (1). These are the scientific terms used to classify groups of carbs that are difficult for our body to digest and that are notorious for triggering digestive symptoms like bloating, gas, loose stools and stomach pain.   You can try low FODMAP diet  - start with eliminating just one column at a time that you feel may be a trigger for you. - the table at the very bottom contains foods that are low in FODMAPs   Sometimes trying to eliminate the FODMAP's from your diet is difficult or tricky, if you are stuggling with trying to do the elimination diet you can try an enzyme.  There is a food enzymes that you sprinkle in or on your food that helps break down the FODMAP. You can read more about the enzyme by going to this site: https://fodzyme.com/   What is Barrett's Esophagus? Barrett's esophagus  is a condition in which the lining of the esophagus (the tube that carries food from your mouth to your stomach) changes due to long-term acid reflux (also known as GERD - gastroesophageal reflux disease). Instead of its normal lining, the esophagus develops tissue similar to the lining of the intestine. This condition increases the risk of developing esophageal adenocarcinoma, a rare but serious form of cancer. However, most people with Barrett's esophagus do not develop cancer.  Causes and Risk Factors Chronic GERD (acid reflux) - the main cause Male gender Age over 26 Caucasian race Smoking Obesity, especially belly fat Family history of Barrett's esophagus or esophageal cancer  Symptoms Barrett's esophagus itself doesn't usually cause symptoms. Most symptoms are related to GERD: Heartburn Acid regurgitation (sour or bitter liquid backing up into your throat) Difficulty swallowing Chest pain (not related to heart disease) Note: Some people with Barrett's esophagus may not have noticeable reflux  symptoms.  Barrett's esophagus is diagnosed using: Upper endoscopy (EGD): A thin, flexible tube with a camera is passed down your throat. Biopsy: Small tissue samples are taken from the esophagus lining and checked under a microscope.  Treatment Options There is no cure for Barrett's esophagus, but the goal is to manage GERD and prevent further changes or progression. Medications Proton pump inhibitors (PPIs): Reduce stomach acid (e.g., omeprazole , esomeprazole ) H2 blockers and antacids for milder cases  Lifestyle Changes Eat smaller meals Avoid trigger foods (spicy, fatty, acidic foods; caffeine; alcohol) Don't lie down after eating Elevate the head of the bed Lose weight if overweight Stop smoking  Surveillance (Monitoring) Routine endoscopy every 3-5 years (or more often if precancerous changes are found)  Treatment for Advanced Cases If precancerous or cancerous cells are found, options may include: Endoscopic therapy (like radiofrequency ablation or endoscopic mucosal resection) Surgery to remove part of the esophagus in rare cases  Living with Barrett's Esophagus Take medications as prescribed Follow up with regular endoscopies Maintain healthy lifestyle habits Be alert for new or worsening symptoms, such as trouble swallowing or unexplained weight loss  When to Call Your Doctor Difficulty swallowing Chest pain not related to the heart Unexplained weight loss Vomiting blood or black stools  Remember: While Barrett's esophagus increases the risk of cancer, careful monitoring and treatment greatly reduce this risk. One potential complication of Barrett's esophagus is that, over time, the abnormal esophageal lining can develop early precancerous changes. The early changes may progress to advanced precancerous changes, and finally to frank esophageal cancer. If undetected, this cancer can spread and invade surrounding tissues. However, progression to cancer is uncommon for  any individual patient; studies that follow patients with Barrett's esophagus reveal that fewer than 0.5 percent of patients develop esophageal cancer per year. Furthermore, patients with Barrett's esophagus appear to live approximately as long as people who are free of this condition.

## 2024-01-24 DIAGNOSIS — H40053 Ocular hypertension, bilateral: Secondary | ICD-10-CM | POA: Diagnosis not present

## 2024-01-24 DIAGNOSIS — H40013 Open angle with borderline findings, low risk, bilateral: Secondary | ICD-10-CM | POA: Diagnosis not present

## 2024-01-25 DIAGNOSIS — J3489 Other specified disorders of nose and nasal sinuses: Secondary | ICD-10-CM | POA: Diagnosis not present

## 2024-01-25 DIAGNOSIS — J342 Deviated nasal septum: Secondary | ICD-10-CM | POA: Diagnosis not present

## 2024-01-25 DIAGNOSIS — J320 Chronic maxillary sinusitis: Secondary | ICD-10-CM | POA: Diagnosis not present

## 2024-03-06 DIAGNOSIS — Z79899 Other long term (current) drug therapy: Secondary | ICD-10-CM | POA: Diagnosis not present

## 2024-03-08 DIAGNOSIS — J019 Acute sinusitis, unspecified: Secondary | ICD-10-CM | POA: Diagnosis not present
